# Patient Record
Sex: Female | Born: 1980 | Race: White | Hispanic: No | Marital: Married | Smoking: Former smoker
Health system: Southern US, Community
[De-identification: ages and names within clinical notes are randomized; demographics above are authoritative.]

## PROBLEM LIST (undated history)

## (undated) DIAGNOSIS — K859 Acute pancreatitis without necrosis or infection, unspecified: Secondary | ICD-10-CM

## (undated) HISTORY — PX: OTHER SURGICAL HISTORY: SHX169

---

## 2020-01-26 ENCOUNTER — Inpatient Hospital Stay (HOSPITAL_BASED_OUTPATIENT_CLINIC_OR_DEPARTMENT_OTHER): Payer: BC Managed Care – PPO

## 2020-01-26 ENCOUNTER — Inpatient Hospital Stay (HOSPITAL_COMMUNITY)
Admission: AD | Admit: 2020-01-26 | Discharge: 2020-01-26 | Disposition: A | Discharge status: Home / Self Care | Payer: BC Managed Care – PPO | Attending: Obstetrics and Gynecology | Admitting: Obstetrics and Gynecology

## 2020-01-26 ENCOUNTER — Other Ambulatory Visit: Payer: Self-pay

## 2020-01-26 ENCOUNTER — Encounter (HOSPITAL_COMMUNITY): Payer: Self-pay | Admitting: Obstetrics and Gynecology

## 2020-01-26 DIAGNOSIS — Z3686 Encounter for antenatal screening for cervical length: Secondary | ICD-10-CM

## 2020-01-26 DIAGNOSIS — Z6791 Unspecified blood type, Rh negative: Secondary | ICD-10-CM | POA: Diagnosis not present

## 2020-01-26 DIAGNOSIS — O09812 Supervision of pregnancy resulting from assisted reproductive technology, second trimester: Secondary | ICD-10-CM

## 2020-01-26 DIAGNOSIS — O360120 Maternal care for anti-D [Rh] antibodies, second trimester, not applicable or unspecified: Secondary | ICD-10-CM

## 2020-01-26 DIAGNOSIS — Z3A23 23 weeks gestation of pregnancy: Secondary | ICD-10-CM

## 2020-01-26 DIAGNOSIS — O4412 Placenta previa with hemorrhage, second trimester: Secondary | ICD-10-CM | POA: Diagnosis not present

## 2020-01-26 DIAGNOSIS — O4402 Placenta previa specified as without hemorrhage, second trimester: Secondary | ICD-10-CM | POA: Diagnosis not present

## 2020-01-26 DIAGNOSIS — O09522 Supervision of elderly multigravida, second trimester: Secondary | ICD-10-CM | POA: Diagnosis not present

## 2020-01-26 DIAGNOSIS — O4692 Antepartum hemorrhage, unspecified, second trimester: Secondary | ICD-10-CM | POA: Diagnosis not present

## 2020-01-26 DIAGNOSIS — Z87891 Personal history of nicotine dependence: Secondary | ICD-10-CM | POA: Insufficient documentation

## 2020-01-26 DIAGNOSIS — O209 Hemorrhage in early pregnancy, unspecified: Secondary | ICD-10-CM | POA: Diagnosis present

## 2020-01-26 HISTORY — DX: Acute pancreatitis without necrosis or infection, unspecified: K85.90

## 2020-01-26 LAB — URINALYSIS, ROUTINE W REFLEX MICROSCOPIC
Bilirubin Urine: NEGATIVE
Glucose, UA: NEGATIVE mg/dL
Ketones, ur: NEGATIVE mg/dL
Leukocytes,Ua: NEGATIVE
Nitrite: NEGATIVE
Protein, ur: NEGATIVE mg/dL
Specific Gravity, Urine: 1.011 (ref 1.005–1.030)
pH: 7 (ref 5.0–8.0)

## 2020-01-26 LAB — CBC
HCT: 35.6 % — ABNORMAL LOW (ref 36.0–46.0)
Hemoglobin: 12.3 g/dL (ref 12.0–15.0)
MCH: 29.7 pg (ref 26.0–34.0)
MCHC: 34.6 g/dL (ref 30.0–36.0)
MCV: 86 fL (ref 80.0–100.0)
Platelets: 270 10*3/uL (ref 150–400)
RBC: 4.14 MIL/uL (ref 3.87–5.11)
RDW: 12.4 % (ref 11.5–15.5)
WBC: 12.5 10*3/uL — ABNORMAL HIGH (ref 4.0–10.5)
nRBC: 0 % (ref 0.0–0.2)

## 2020-01-26 LAB — WET PREP, GENITAL
Clue Cells Wet Prep HPF POC: NONE SEEN
Sperm: NONE SEEN
Trich, Wet Prep: NONE SEEN
Yeast Wet Prep HPF POC: NONE SEEN

## 2020-01-26 MED ORDER — RHO D IMMUNE GLOBULIN 1500 UNIT/2ML IJ SOSY
300.0000 ug | PREFILLED_SYRINGE | Freq: Once | INTRAMUSCULAR | Status: AC
  Administered 2020-01-26: 300 ug via INTRAMUSCULAR
  Filled 2020-01-26: qty 2

## 2020-01-26 NOTE — MAU Note (Signed)
Pt reports she had some brown spotting this morning when she wiped and now is bright red. Reports a few mild cramps since this morning. Good fetal movement felt.

## 2020-01-26 NOTE — Discharge Instructions (Signed)
Placenta Previa Placenta previa is a condition in which the placenta implants in the lower part of the uterus in pregnant women. The placenta either partially or completely covers the opening to the cervix. This is a problem because the baby must pass through the cervix during delivery. There are three types of placenta previa:  Marginal placenta previa. The placenta reaches within an inch (2.5 cm) of the cervical opening but does not cover it.  Partial placenta previa. The placenta covers part of the cervical opening.  Complete placenta previa. The placenta covers the entire cervical opening. If the previa is marginal or partial and it is diagnosed in the first half of pregnancy, the placenta may move into a normal position as the pregnancy progresses and may no longer cover the cervix. It is important to keep all prenatal visits with your health care provider so you can be more closely monitored. What are the causes? The cause of this condition is not known. What increases the risk? This condition is more likely to develop in women who:  Are carrying more than one baby (multiples).  Have an abnormally shaped uterus.  Have scars on the lining of the uterus.  Have had surgeries involving the uterus, such as a cesarean delivery.  Have delivered a baby before.  Have a history of placenta previa.  Have smoked or used cocaine during pregnancy.  Are age 35 or older during pregnancy. What are the signs or symptoms? The main symptom of this condition is sudden, painless vaginal bleeding during the second half of pregnancy. The amount of bleeding can be very light at first, and it usually stops on its own. Heavier bleeding episodes may also happen. Some women with placenta previa may have no bleeding at all. How is this diagnosed?  This condition is diagnosed: ? From an ultrasound. This test uses sound waves to find where the placenta is located before you have any bleeding  episodes. ? During a checkup after vaginal bleeding is noticed.  If you are diagnosed with a partial or complete previa, digital exams with fingers will generally be avoided. Your health care provider will still perform a speculum exam.  If you did not have an ultrasound during your pregnancy, placenta previa may not be diagnosed until bleeding occurs during labor. How is this treated? Treatment for this condition may include:  Decreased activity.  Bed rest at home or in the hospital.  Pelvic rest. Nothing is placed inside the vagina during pelvic rest. This means not having sex and not using tampons or douches.  A blood transfusion to replace blood that you have lost (maternal blood loss).  A cesarean delivery. This may be performed if: ? The bleeding is heavy and cannot be controlled. ? The placenta completely covers the cervix.  Medicines to stop premature labor or to help the baby's lungs to mature. This treatment may be used if you need delivery before your pregnancy is full-term. Your treatment will be decided based on:  How much you are bleeding, or whether the bleeding has stopped.  How far along you are in your pregnancy.  The condition of your baby.  The type of placenta previa that you have.  Follow these instructions at home:  Get plenty of rest and lessen activity as told by your health care provider.  Stay on bed rest for as long as told by your health care provider.  Do not have sex, use tampons, use a douche, or place anything inside of   your vagina if your health care provider recommended pelvic rest.  Take over-the-counter and prescription medicines as told by your health care provider.  Keep all follow-up visits as told by your health care provider. This is important. Get help right away if:  You have vaginal bleeding, even if in small amounts and even if you have no pain.  You have cramping or regular contractions.  You have pain in your abdomen or  your lower back.  You have a feeling of increased pressure in your pelvis.  You have increased watery or bloody mucus from the vagina. This information is not intended to replace advice given to you by your health care provider. Make sure you discuss any questions you have with your health care provider. Document Revised: 03/13/2017 Document Reviewed: 10/13/2015 Elsevier Patient Education  2020 [employer]vier Inc.  

## 2020-01-26 NOTE — MAU Provider Note (Signed)
History     CSN: [id number]  Arrival date and time: 01/26/20 1534   First Provider Initiated Contact with Patient 01/26/20 1627      Chief Complaint  Patient presents with  . Vaginal Bleeding   39 y.o. G2P1001 @23 .1 wks presenting with spotting. Reports onset a few hours ago. First saw some brown sticky discharge then became more red when she wiped. Has not needed a pad or liner. Denies recent IC. No abdominal pain or cramping. She is unsure if she has felt FM yet this pregnancy.   OB History    Gravida  2   Para  1   Term  1   Preterm      AB      Living  1     SAB      TAB      Ectopic      Multiple      Live Births  1           Past Medical History:  Diagnosis Date  . Pancreatitis     Past Surgical History:  Procedure Laterality Date  . breast augmentation      No family history on file.  Social History   Tobacco Use  . Smoking status: Former Smoker    Types: Cigarettes    Quit date: 2021    Years since quitting: 0.7  . Smokeless tobacco: Never Used  Vaping Use  . Vaping Use: Never used  Substance Use Topics  . Alcohol use: Not Currently  . Drug use: Never    Allergies:  Allergies  Allergen Reactions  . Oxycodone Nausea And Vomiting    Medications Prior to Admission  Medication Sig Dispense Refill Last Dose  . calcium carbonate (TUMS - DOSED IN MG ELEMENTAL CALCIUM) 500 MG chewable tablet Chew 1 tablet by mouth daily.   01/26/2020 at Unknown time  . Prenatal Vit-Fe Fumarate-FA (MULTIVITAMIN-PRENATAL) 27-0.8 MG TABS tablet Take 1 tablet by mouth daily at 12 noon.   01/25/2020 at Unknown time    Review of Systems  Gastrointestinal: Negative for abdominal pain.  Genitourinary: Positive for vaginal bleeding.   Physical Exam   Blood pressure 128/82, pulse 79, temperature 99 F (37.2 C), resp. rate 18.  Physical Exam Vitals and nursing note reviewed.  Constitutional:      General: She is not in acute distress.     Appearance: Normal appearance.  HENT:     Head: Normocephalic and atraumatic.  Cardiovascular:     Rate and Rhythm: Normal rate.  Pulmonary:     Effort: Pulmonary effort is normal. No respiratory distress.  Abdominal:     Palpations: Abdomen is soft.     Tenderness: There is no abdominal tenderness.  Genitourinary:    Comments: External: no lesions or erythema Vagina: rugated, pink, moist, scant dark bloody discharge Cervix visually closed  Musculoskeletal:        General: Normal range of motion.     Cervical back: Normal range of motion.  Skin:    General: Skin is warm and dry.  Neurological:     General: No focal deficit present.     Mental Status: She is alert and oriented to person, place, and time.  Psychiatric:        Mood and Affect: Mood is anxious.        Behavior: Behavior normal.   EFM: 140 bpm, mod variability, + accels, rare variable decels Toco: none  Results for orders placed or performed  during the hospital encounter of 01/26/20 (from the past 24 hour(s))  Urinalysis, Routine w reflex microscopic     Status: Abnormal   Collection Time: 01/26/20  3:52 PM  Result Value Ref Range   Color, Urine YELLOW YELLOW   APPearance HAZY (A) CLEAR   Specific Gravity, Urine 1.011 1.005 - 1.030   pH 7.0 5.0 - 8.0   Glucose, UA NEGATIVE NEGATIVE mg/dL   Hgb urine dipstick LARGE (A) NEGATIVE   Bilirubin Urine NEGATIVE NEGATIVE   Ketones, ur NEGATIVE NEGATIVE mg/dL   Protein, ur NEGATIVE NEGATIVE mg/dL   Nitrite NEGATIVE NEGATIVE   Leukocytes,Ua NEGATIVE NEGATIVE   RBC / HPF 0-5 0 - 5 RBC/hpf   WBC, UA 0-5 0 - 5 WBC/hpf   Bacteria, UA RARE (A) NONE SEEN   Squamous Epithelial / LPF 6-10 0 - 5   Mucus PRESENT    Amorphous Crystal PRESENT   Wet prep, genital     Status: Abnormal   Collection Time: 01/26/20  4:40 PM   Specimen: Cervix  Result Value Ref Range   Yeast Wet Prep HPF POC NONE SEEN NONE SEEN   Trich, Wet Prep NONE SEEN NONE SEEN   Clue Cells Wet Prep HPF  POC NONE SEEN NONE SEEN   WBC, Wet Prep HPF POC MANY (A) NONE SEEN   Sperm NONE SEEN   Rh IG workup (includes ABO/Rh)     Status: None (Preliminary result)   Collection Time: 01/26/20  5:25 PM  Result Value Ref Range   Gestational Age(Wks) 23    ABO/RH(D) A NEG    Antibody Screen NEG    Unit Number [id number]    Blood Component Type RHIG    Unit division 00    Status of Unit ISSUED    Transfusion Status      OK TO TRANSFUSE Performed at [facility], [address], [place], [place] [address]   CBC     Status: Abnormal   Collection Time: 01/26/20  5:25 PM  Result Value Ref Range   WBC 12.5 (H) 4.0 - 10.5 K/uL   RBC 4.14 3.87 - 5.11 MIL/uL   Hemoglobin 12.3 12.0 - 15.0 g/dL   HCT 74.2 (L) 36 - 46 %   MCV 86.0 80.0 - 100.0 fL   MCH 29.7 26.0 - 34.0 pg   MCHC 34.6 30.0 - 36.0 g/dL   RDW 59.5 63.8 - 75.6 %   Platelets 270 150 - 400 K/uL   nRBC 0.0 0.0 - 0.2 %   MAU Course  Procedures Rhogam  MDM Records reviewed: pregnancy complicated by complete previa, AMA, and IVF. US shows anterior placenta previa and normal cervical length. No further bleeding. Consult with Dr. Macon Large, stable for discharge home with strict precautions.   Assessment and Plan   1. [redacted] weeks gestation of pregnancy   2. Encounter for antenatal screening for cervical length   3. 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 Rh negative state in antepartum period    Discharge home Follow up at [facility] as scheduled tomorrow Strict bleeding/PTL precautions Pelvic rest  Allergies as of 01/26/2020      Reactions   Oxycodone Nausea And Vomiting      Medication List    TAKE these medications   calcium carbonate 500 MG chewable tablet Commonly known as: TUMS - dosed in mg elemental calcium Chew 1 tablet by mouth daily.   multivitamin-prenatal 27-0.8 MG Tabs tablet Take 1 tablet by mouth  daily at 12 noon.      Donette Larry, CNM 01/26/2020, 8:53 PM

## 2020-01-27 LAB — GC/CHLAMYDIA PROBE AMP (~~LOC~~) NOT AT ARMC
Chlamydia: NEGATIVE
Comment: NEGATIVE
Comment: NORMAL
Neisseria Gonorrhea: NEGATIVE

## 2020-01-27 LAB — RH IG WORKUP (INCLUDES ABO/RH)
ABO/RH(D): A NEG
Antibody Screen: NEGATIVE
Gestational Age(Wks): 23
Unit division: 0

## 2020-04-26 ENCOUNTER — Other Ambulatory Visit: Payer: Self-pay

## 2020-04-26 ENCOUNTER — Inpatient Hospital Stay (HOSPITAL_COMMUNITY)
Admission: AD | Admit: 2020-04-26 | Discharge: 2020-04-27 | DRG: 786 | Disposition: A | Discharge status: Home / Self Care | Payer: BC Managed Care – PPO | Attending: Obstetrics and Gynecology | Admitting: Obstetrics and Gynecology

## 2020-04-26 ENCOUNTER — Inpatient Hospital Stay (HOSPITAL_BASED_OUTPATIENT_CLINIC_OR_DEPARTMENT_OTHER): Payer: BC Managed Care – PPO

## 2020-04-26 ENCOUNTER — Encounter (HOSPITAL_COMMUNITY): Payer: Self-pay | Admitting: Obstetrics and Gynecology

## 2020-04-26 DIAGNOSIS — O4593 Premature separation of placenta, unspecified, third trimester: Secondary | ICD-10-CM | POA: Diagnosis present

## 2020-04-26 DIAGNOSIS — U071 COVID-19: Secondary | ICD-10-CM | POA: Diagnosis present

## 2020-04-26 DIAGNOSIS — Z6791 Unspecified blood type, Rh negative: Secondary | ICD-10-CM

## 2020-04-26 DIAGNOSIS — Z885 Allergy status to narcotic agent status: Secondary | ICD-10-CM | POA: Diagnosis not present

## 2020-04-26 DIAGNOSIS — Z3A36 36 weeks gestation of pregnancy: Secondary | ICD-10-CM | POA: Diagnosis not present

## 2020-04-26 DIAGNOSIS — O4413 Placenta previa with hemorrhage, third trimester: Secondary | ICD-10-CM

## 2020-04-26 DIAGNOSIS — D62 Acute posthemorrhagic anemia: Secondary | ICD-10-CM | POA: Diagnosis not present

## 2020-04-26 DIAGNOSIS — O9852 Other viral diseases complicating childbirth: Secondary | ICD-10-CM | POA: Diagnosis present

## 2020-04-26 DIAGNOSIS — Z880 Allergy status to penicillin: Secondary | ICD-10-CM

## 2020-04-26 DIAGNOSIS — O26893 Other specified pregnancy related conditions, third trimester: Secondary | ICD-10-CM | POA: Diagnosis present

## 2020-04-26 DIAGNOSIS — O9081 Anemia of the puerperium: Secondary | ICD-10-CM | POA: Diagnosis not present

## 2020-04-26 DIAGNOSIS — O98513 Other viral diseases complicating pregnancy, third trimester: Secondary | ICD-10-CM | POA: Diagnosis present

## 2020-04-26 DIAGNOSIS — Z87891 Personal history of nicotine dependence: Secondary | ICD-10-CM

## 2020-04-26 DIAGNOSIS — O4400 Placenta previa specified as without hemorrhage, unspecified trimester: Secondary | ICD-10-CM

## 2020-04-26 DIAGNOSIS — O4403 Placenta previa specified as without hemorrhage, third trimester: Secondary | ICD-10-CM

## 2020-04-26 LAB — WET PREP, GENITAL
Clue Cells Wet Prep HPF POC: NONE SEEN
Sperm: NONE SEEN
Trich, Wet Prep: NONE SEEN
Yeast Wet Prep HPF POC: NONE SEEN

## 2020-04-26 LAB — RESP PANEL BY RT-PCR (FLU A&B, COVID) ARPGX2
Influenza A by PCR: NEGATIVE
Influenza B by PCR: NEGATIVE
SARS Coronavirus 2 by RT PCR: POSITIVE — AB

## 2020-04-26 LAB — CBC
HCT: 36.2 % (ref 36.0–46.0)
Hemoglobin: 13 g/dL (ref 12.0–15.0)
MCH: 31.8 pg (ref 26.0–34.0)
MCHC: 35.9 g/dL (ref 30.0–36.0)
MCV: 88.5 fL (ref 80.0–100.0)
Platelets: 232 10*3/uL (ref 150–400)
RBC: 4.09 MIL/uL (ref 3.87–5.11)
RDW: 12.8 % (ref 11.5–15.5)
WBC: 11.8 10*3/uL — ABNORMAL HIGH (ref 4.0–10.5)
nRBC: 0 % (ref 0.0–0.2)

## 2020-04-26 LAB — COMPREHENSIVE METABOLIC PANEL
ALT: 40 U/L (ref 0–44)
AST: 53 U/L — ABNORMAL HIGH (ref 15–41)
Albumin: 3 g/dL — ABNORMAL LOW (ref 3.5–5.0)
Alkaline Phosphatase: 154 U/L — ABNORMAL HIGH (ref 38–126)
Anion gap: 9 (ref 5–15)
BUN: 6 mg/dL (ref 6–20)
CO2: 21 mmol/L — ABNORMAL LOW (ref 22–32)
Calcium: 9 mg/dL (ref 8.9–10.3)
Chloride: 102 mmol/L (ref 98–111)
Creatinine, Ser: 0.51 mg/dL (ref 0.44–1.00)
GFR, Estimated: >60 mL/min (ref >60)
Glucose, Bld: 107 mg/dL — ABNORMAL HIGH (ref 70–99)
Potassium: 4.3 mmol/L (ref 3.5–5.1)
Sodium: 132 mmol/L — ABNORMAL LOW (ref 135–145)
Total Bilirubin: 0.5 mg/dL (ref 0.3–1.2)
Total Protein: 5.8 g/dL — ABNORMAL LOW (ref 6.5–8.1)

## 2020-04-26 LAB — TYPE AND SCREEN
ABO/RH(D): A NEG
Antibody Screen: NEGATIVE

## 2020-04-26 MED ORDER — BETAMETHASONE SOD PHOS & ACET 6 (3-3) MG/ML IJ SUSP
12.0000 mg | INTRAMUSCULAR | Status: AC
  Administered 2020-04-26 – 2020-04-27 (×2): 12 mg via INTRAMUSCULAR
  Filled 2020-04-26: qty 5

## 2020-04-26 MED ORDER — DOCUSATE SODIUM 100 MG PO CAPS
100.0000 mg | ORAL_CAPSULE | Freq: Every day | ORAL | Status: DC
  Administered 2020-04-26 – 2020-04-27 (×2): 100 mg via ORAL
  Filled 2020-04-26 (×2): qty 1

## 2020-04-26 MED ORDER — RHO D IMMUNE GLOBULIN 1500 UNIT/2ML IJ SOSY
300.0000 ug | PREFILLED_SYRINGE | Freq: Once | INTRAMUSCULAR | Status: AC
  Administered 2020-04-27: 300 ug via INTRAMUSCULAR
  Filled 2020-04-26: qty 2

## 2020-04-26 MED ORDER — PRENATAL MULTIVITAMIN CH
1.0000 | ORAL_TABLET | Freq: Every day | ORAL | Status: DC
  Administered 2020-04-26 – 2020-04-27 (×2): 1 via ORAL
  Filled 2020-04-26 (×2): qty 1

## 2020-04-26 MED ORDER — CALCIUM CARBONATE ANTACID 500 MG PO CHEW
2.0000 | CHEWABLE_TABLET | ORAL | Status: DC | PRN
  Administered 2020-04-26 – 2020-04-27 (×2): 400 mg via ORAL
  Filled 2020-04-26 (×2): qty 2

## 2020-04-26 MED ORDER — ZOLPIDEM TARTRATE 5 MG PO TABS: 5.0000 mg | ORAL_TABLET | Freq: Every evening | ORAL | Status: DC | PRN

## 2020-04-26 MED ORDER — ACETAMINOPHEN 325 MG PO TABS
650.0000 mg | ORAL_TABLET | ORAL | Status: DC | PRN
  Administered 2020-04-26 – 2020-04-27 (×2): 650 mg via ORAL
  Filled 2020-04-26 (×2): qty 2

## 2020-04-26 NOTE — MAU Provider Note (Signed)
History     [id number]  Arrival date and time: 04/26/20 8413    Chief Complaint  Patient presents with  . Vaginal Bleeding     HPI Tricia Hancock is a 40 y.o. at [redacted]w[redacted]d by patient report with PMHx notable for placement of previa in this pregnancy, IVF this pregnancy, advanced maternal age, who presents for vaginal bleeding.   Review of outside prenatal records from [facility] office (in media tab): Prenatal records notable for placenta previa and a negative blood type.  Review of discharge summary from last admission on October 14: Seen in the MAU on October 14 with vaginal bleeding.  No active bleeding at that time, was given RhoGAM and discharged home  Patient reports that earlier this morning she had a gush of blood, estimates about a cup when she woke up that soaked her pajamas.  Immediately came to the MAU to be evaluated.  Since then has had some blood while wiping but no consistent trickle.  Is having some back pain and some intermittent Braxton Hicks but no significant contractions.  Feeling baby move.  Has not had any gush of fluid.  Has had some change in vaginal discharge recently.  No recent intercourse.  Reports she was supposed to have an ultrasound today to assess her previa but instead came here.  Results not available in the computer but patient reports that placenta was starting to migrate off the office at her last ultrasound.   --/--/A NEG (01/13 1031)  OB History    Gravida  2   Para  1   Term  1   Preterm      AB      Living  1     SAB      IAB      Ectopic      Multiple      Live Births  1           Past Medical History:  Diagnosis Date  . Pancreatitis     Past Surgical History:  Procedure Laterality Date  . breast augmentation      History reviewed. No pertinent family history.  Social History   Socioeconomic History  . Marital status: Married    Spouse name: Not on file  . Number of children: Not on file  .  Years of education: Not on file  . Highest education level: Not on file  Occupational History  . Not on file  Tobacco Use  . Smoking status: Former Smoker    Types: Cigarettes    Quit date: 2021    Years since quitting: 1.0  . Smokeless tobacco: Never Used  Vaping Use  . Vaping Use: Never used  Substance and Sexual Activity  . Alcohol use: Not Currently  . Drug use: Never  . Sexual activity: Yes  Other Topics Concern  . Not on file  Social History Narrative  . Not on file   Social Determinants of Health   Financial Resource Strain: Not on file  Food Insecurity: Not on file  Transportation Needs: Not on file  Physical Activity: Not on file  Stress: Not on file  Social Connections: Not on file  Intimate Partner Violence: Not on file    Allergies  Allergen Reactions  . Oxycodone Nausea And Vomiting    No current facility-administered medications on file prior to encounter.   Current Outpatient Medications on File Prior to Encounter  Medication Sig Dispense Refill  . famotidine (PEPCID) 20 MG tablet  Take 20 mg by mouth 2 (two) times daily.    . Prenatal Vit-Fe Fumarate-FA (MULTIVITAMIN-PRENATAL) 27-0.8 MG TABS tablet Take 1 tablet by mouth daily at 12 noon.    . calcium carbonate (TUMS - DOSED IN MG ELEMENTAL CALCIUM) 500 MG chewable tablet Chew 1 tablet by mouth daily.       ROS Pertinent positives and negative per HPI, all others reviewed and negative  Physical Exam   BP 119/60   Pulse (!) 102   Resp 18   Ht 5\' 8"  (1.727 m)   Wt 96.2 kg   BMI 32.23 kg/m   Physical Exam Vitals reviewed.  Constitutional:      General: She is not in acute distress.    Appearance: She is well-developed and well-nourished. She is not diaphoretic.  Eyes:     General: No scleral icterus. Pulmonary:     Effort: Pulmonary effort is normal. No respiratory distress.  Abdominal:     General: There is no distension.     Palpations: Abdomen is soft.     Tenderness: There is no  abdominal tenderness. There is no guarding or rebound.  Genitourinary:    Comments: Sterile speculum exam shows scant amount of blood in the vaginal vault, cervix closed and long, small amount of blood at the cervical os but no active bleeding seen. Musculoskeletal:        General: No edema.  Skin:    General: Skin is warm and dry.  Neurological:     Mental Status: She is alert.     Coordination: Coordination normal.  Psychiatric:        Mood and Affect: Mood and affect normal.      Cervical Exam    Bedside Ultrasound Not done  My interpretation: n/a  FHT Baseline 155, moderate variability, +accels, no decels Toco: no clear contractions Cat: I  Labs Results for orders placed or performed during the hospital encounter of 04/26/20 (from the past 24 hour(s))  CBC     Status: Abnormal   Collection Time: 04/26/20 10:31 AM  Result Value Ref Range   WBC 11.8 (H) 4.0 - 10.5 K/uL   RBC 4.09 3.87 - 5.11 MIL/uL   Hemoglobin 13.0 12.0 - 15.0 g/dL   HCT 04/28/20 19.4 - 17.4 %   MCV 88.5 80.0 - 100.0 fL   MCH 31.8 26.0 - 34.0 pg   MCHC 35.9 30.0 - 36.0 g/dL   RDW 08.1 44.8 - 18.5 %   Platelets 232 150 - 400 K/uL   nRBC 0.0 0.0 - 0.2 %  Type and screen [facility]     Status: None   Collection Time: 04/26/20 10:31 AM  Result Value Ref Range   ABO/RH(D) A NEG    Antibody Screen NEG    Sample Expiration      04/29/2020,2359 Performed at [facility], [address] [place], [place]   Comprehensive metabolic panel     Status: Abnormal   Collection Time: 04/26/20 10:31 AM  Result Value Ref Range   Sodium 132 (L) 135 - 145 mmol/L   Potassium 4.3 3.5 - 5.1 mmol/L   Chloride 102 98 - 111 mmol/L   CO2 21 (L) 22 - 32 mmol/L   Glucose, Bld 107 (H) 70 - 99 mg/dL   BUN 6 6 - 20 mg/dL   Creatinine, Ser 04/28/20 0.44 - 1.00 mg/dL   Calcium 9.0 8.9 - 6.37 mg/dL   Total Protein 5.8 (L) 6.5 -  8.1 g/dL   Albumin 3.0 (L) 3.5 - 5.0 g/dL   AST 53 (H) 15 - 41 U/L    ALT 40 0 - 44 U/L   Alkaline Phosphatase 154 (H) 38 - 126 U/L   Total Bilirubin 0.5 0.3 - 1.2 mg/dL   GFR, Estimated >21>60 >30>60 mL/min   Anion gap 9 5 - 15  Wet prep, genital     Status: Abnormal   Collection Time: 04/26/20 10:59 AM   Specimen: Vaginal  Result Value Ref Range   Yeast Wet Prep HPF POC NONE SEEN NONE SEEN   Trich, Wet Prep NONE SEEN NONE SEEN   Clue Cells Wet Prep HPF POC NONE SEEN NONE SEEN   WBC, Wet Prep HPF POC FEW (A) NONE SEEN   Sperm NONE SEEN     Imaging US MFM OB Transvaginal  Result Date: 04/26/2020 ----------------------------------------------------------------------  OBSTETRICS REPORT                       (Signed Final 04/26/2020 12:28 pm) ---------------------------------------------------------------------- Patient Info  ID #:       [id number]                          D.O.B.:  Apr 07, 1981 (39 yrs)  Name:       Tricia Hancock                 Visit Date: 04/26/2020 11:47 am ---------------------------------------------------------------------- Performed By  Attending:        Noralee Spaceavi Shankar MD        [facility]      [facility]  Performed By:     Marcellina MillinKelly L Moser          [facility] ---------------------------------------------------------------------- Orders  #  Description                           Code        Ordered By  1  US MFM OB TRANSVAGINAL                [id number]     Lew Prout ----------------------------------------------------------------------  #  Order #                     Accession #                Episode #  1  [id number]                   [id number]                 [id number] ---------------------------------------------------------------------- Indications  Placenta previa with hemorrhage, third         O44.13  trimester  [redacted] weeks gestation of pregnancy                Z3A.36 ---------------------------------------------------------------------- Fetal  Evaluation  Num Of Fetuses:         1  Fetal Heart Rate(bpm):  176  Cardiac Activity:  Observed  Presentation:           Cephalic  Placenta:               Anterior previa  Amniotic Fluid  AFI FV:      Within normal limits  AFI Sum(cm)     %Tile       Largest Pocket(cm)  14.7            54          5  RUQ(cm)       RLQ(cm)       LUQ(cm)        LLQ(cm)  5             3.7           2.7            3.3 ---------------------------------------------------------------------- OB History  Gravidity:    2         Term:   1 ---------------------------------------------------------------------- Gestational Age  Clinical EDD:  36w 1d                                        EDD:   05/23/20  Best:          36w 1d     Det. By:  Clinical EDD             EDD:   05/23/20 ---------------------------------------------------------------------- Cervix Uterus Adnexa  Cervix  Normal appearance by transvaginal scan ---------------------------------------------------------------------- Impression  Patient is being evaluated the MAU for complaints of vaginal  bleeding.  She has known placenta previa diagnosed on  ultrasound.  A limited ultrasound study showed normal amniotic fluid and  good fetal activity.  We performed a transvaginal ultrasound  to evaluate the placenta.  Placenta is anterior and its tendon  edge is reaching the internal os.  No evidence of  retroplacental hemorrhage  Impression placenta previa.  Discussed findings and recommendations with Dr. Adrian Blackwater  (MAU). ---------------------------------------------------------------------- Recommendations  - Admission and inpatient management with antenatal  corticosteroids.  -Delivery at [redacted] weeks gestation.  -If significant bleeding recurs, patient should be delivered  regardless of completion of betamethasone course. ----------------------------------------------------------------------                  Noralee Space, MD Electronically Signed Final Report   04/26/2020 12:28 pm  ----------------------------------------------------------------------   MAU Course  Procedures  Lab Orders     SARS CORONAVIRUS 2 (TAT 6-24 HRS) Nasopharyngeal Nasopharyngeal Swab     Wet prep, genital     CBC     Comprehensive metabolic panel Meds ordered this encounter  Medications  . rho (d) immune globulin (RHIG/RHOPHYLAC) injection 300 mcg  . betamethasone acetate-betamethasone sodium phosphate (CELESTONE) injection 12 mg  . acetaminophen (TYLENOL) tablet 650 mg  . zolpidem (AMBIEN) tablet 5 mg  . docusate sodium (COLACE) capsule 100 mg  . calcium carbonate (TUMS - dosed in mg elemental calcium) chewable tablet 400 mg of elemental calcium  . prenatal multivitamin tablet 1 tablet    Imaging Orders     [place] MFM OB Transvaginal  MDM moderate  Assessment and Plan  #Bleeding in setting of placenta previa Patient presenting with episode of large-volume bleeding that is since resolved and evidence of such on sterile speculum exam setting of known anterior placenta previa.  Given 1 month since last evaluation of her placenta will send  for transvaginal ultrasound.  We will also give RhoGAM since has now been 13 weeks since she last received it and draw basic labs. 11:18 AM  Ultrasound shows patient continues to have placenta previa. Discussed with MFM Dr. Warren DanesShane Carr as well [facility] Mindi SlickerBanga, plan to admit, give antenatal steroids, RhoGAM, and observe. Delivery if has bleeding episode again, otherwise can go to 37 weeks.   #FWB FHT Cat I NST: Reactive  Admitted to antenatal  Venora MaplesMatthew M Mycheal [place]

## 2020-04-26 NOTE — H&P (Addendum)
Curtina Grills is a 40 y.o. at [redacted]w[redacted]d with PMHx notable for placenta of previa. She conceived via IVF, she is of advanced maternal age, and she presents for vaginal bleeding.  She was seen in the MAU on October 14 for vaginal bleeding.  No active bleeding at that time, was given RhoGAM and discharged home  Patient reports that earlier this morning she had a gush of blood, estimates about a cup when she woke up that soaked her pajamas.  Immediately came to the MAU to be evaluated.  Since then has had some blood while wiping but no consistent trickle.  Is having some back pain and some intermittent Braxton Hicks but no significant contractions.  Feeling baby move.  Has not had any gush of fluid.  Has had some change in vaginal discharge recently.  No recent intercourse.  Pt  was supposed to have an ultrasound today to assess her previa but instead came here.    Menstrual History: No LMP recorded. Patient is pregnant.    Past Medical History:  Diagnosis Date  . Pancreatitis     Past Surgical History:  Procedure Laterality Date  . breast augmentation      History reviewed. No pertinent family history.  Social History:  reports that she quit smoking about a year ago. Her smoking use included cigarettes. She has never used smokeless tobacco. She reports previous alcohol use. She reports that she does not use drugs.  Allergies:  Allergies  Allergen Reactions  . Oxycodone Nausea And Vomiting    Medications Prior to Admission  Medication Sig Dispense Refill Last Dose  . famotidine (PEPCID) 20 MG tablet Take 20 mg by mouth 2 (two) times daily.     . Prenatal Vit-Fe Fumarate-FA (MULTIVITAMIN-PRENATAL) 27-0.8 MG TABS tablet Take 1 tablet by mouth daily at 12 noon.   Past Month at Unknown time  . calcium carbonate (TUMS - DOSED IN MG ELEMENTAL CALCIUM) 500 MG chewable tablet Chew 1 tablet by mouth daily.       Review of Systems  Constitutional: Negative for activity change and fatigue.   Eyes: Negative for photophobia and visual disturbance.  Respiratory: Negative for chest tightness and shortness of breath.   Cardiovascular: Negative for chest pain, palpitations and leg swelling.  Gastrointestinal: Positive for abdominal pain.  Genitourinary: Positive for pelvic pain.  Musculoskeletal: Positive for back pain.  Neurological: Negative for light-headedness and headaches.  Psychiatric/Behavioral: The patient is nervous/anxious.     Blood pressure (!) 117/53, pulse 100, temperature 99 F (37.2 C), temperature source Oral, resp. rate 18, height 5\' 8"  (1.727 m), weight 96.2 kg, SpO2 98 %. Physical Exam Vitals and nursing note reviewed. Exam conducted with a chaperone present.  Constitutional:      Appearance: Normal appearance.  Pulmonary:     Effort: Pulmonary effort is normal.  Musculoskeletal:        General: Normal range of motion.     Cervical back: Normal range of motion.  Skin:    General: Skin is warm.     Capillary Refill: Capillary refill takes 2 to 3 seconds.  Neurological:     General: No focal deficit present.     Mental Status: She is alert and oriented to person, place, and time. Mental status is at baseline.  Psychiatric:        Mood and Affect: Mood normal.        Behavior: Behavior normal.        Thought Content: Thought content normal.  Judgment: Judgment normal.     Results for orders placed or performed during the hospital encounter of 04/26/20 (from the past 24 hour(s))  CBC     Status: Abnormal   Collection Time: 04/26/20 10:31 AM  Result Value Ref Range   WBC 11.8 (H) 4.0 - 10.5 K/uL   RBC 4.09 3.87 - 5.11 MIL/uL   Hemoglobin 13.0 12.0 - 15.0 g/dL   HCT 28.7 86.7 - 67.2 %   MCV 88.5 80.0 - 100.0 fL   MCH 31.8 26.0 - 34.0 pg   MCHC 35.9 30.0 - 36.0 g/dL   RDW 09.4 70.9 - 62.8 %   Platelets 232 150 - 400 K/uL   nRBC 0.0 0.0 - 0.2 %  Type and screen [facility]     Status: None   Collection Time: 04/26/20 10:31  AM  Result Value Ref Range   ABO/RH(D) A NEG    Antibody Screen NEG    Sample Expiration      04/29/2020,2359 Performed at [facility], [address], [place], [place] [address]   Comprehensive metabolic panel     Status: Abnormal   Collection Time: 04/26/20 10:31 AM  Result Value Ref Range   Sodium 132 (L) 135 - 145 mmol/L   Potassium 4.3 3.5 - 5.1 mmol/L   Chloride 102 98 - 111 mmol/L   CO2 21 (L) 22 - 32 mmol/L   Glucose, Bld 107 (H) 70 - 99 mg/dL   BUN 6 6 - 20 mg/dL   Creatinine, Ser 4.76 0.44 - 1.00 mg/dL   Calcium 9.0 8.9 - 54.6 mg/dL   Total Protein 5.8 (L) 6.5 - 8.1 g/dL   Albumin 3.0 (L) 3.5 - 5.0 g/dL   AST 53 (H) 15 - 41 U/L   ALT 40 0 - 44 U/L   Alkaline Phosphatase 154 (H) 38 - 126 U/L   Total Bilirubin 0.5 0.3 - 1.2 mg/dL   GFR, Estimated >50 >35 mL/min   Anion gap 9 5 - 15  Wet prep, genital     Status: Abnormal   Collection Time: 04/26/20 10:59 AM   Specimen: Vaginal  Result Value Ref Range   Yeast Wet Prep HPF POC NONE SEEN NONE SEEN   Trich, Wet Prep NONE SEEN NONE SEEN   Clue Cells Wet Prep HPF POC NONE SEEN NONE SEEN   WBC, Wet Prep HPF POC FEW (A) NONE SEEN   Sperm NONE SEEN   Resp Panel by RT-PCR (Flu A&B, Covid) Nasopharyngeal Swab     Status: Abnormal   Collection Time: 04/26/20  1:09 PM   Specimen: Nasopharyngeal Swab; Nasopharyngeal(NP) swabs in vial transport medium  Result Value Ref Range   SARS Coronavirus 2 by RT PCR POSITIVE (A) NEGATIVE   Influenza A by PCR NEGATIVE NEGATIVE   Influenza B by PCR NEGATIVE NEGATIVE    [place] MFM OB Transvaginal  Result Date: 04/26/2020 ----------------------------------------------------------------------  OBSTETRICS REPORT                       (Signed Final 04/26/2020 12:28 pm) ---------------------------------------------------------------------- Patient Info  ID #:       [id number]                          D.O.B.:  Sep 12, 1980 (39 yrs)  Name:       Tricia Hancock  Visit Date: 04/26/2020  11:47 am ---------------------------------------------------------------------- Performed By  Attending:        Noralee Spaceavi Shankar MD        [facility]      [facility]  Performed By:     Marcellina MillinKelly L Moser          [facility] ---------------------------------------------------------------------- Orders  #  Description                           Code        Ordered By  1  US MFM OB TRANSVAGINAL                [id number]     MATTHEW ECKSTAT ----------------------------------------------------------------------  #  Order #                     Accession #                Episode #  1  [id number]                   [id number]                 [id number] ---------------------------------------------------------------------- Indications  Placenta previa with hemorrhage, third         O44.13  trimester  [redacted] weeks gestation of pregnancy                Z3A.36 ---------------------------------------------------------------------- Fetal Evaluation  Num Of Fetuses:         1  Fetal Heart Rate(bpm):  176  Cardiac Activity:       Observed  Presentation:           Cephalic  Placenta:               Anterior previa  Amniotic Fluid  AFI FV:      Within normal limits  AFI Sum(cm)     %Tile       Largest Pocket(cm)  14.7            54          5  RUQ(cm)       RLQ(cm)       LUQ(cm)        LLQ(cm)  5             3.7           2.7            3.3 ---------------------------------------------------------------------- OB History  Gravidity:    2         Term:   1 ---------------------------------------------------------------------- Gestational Age  Clinical EDD:  36w 1d                                        EDD:   05/23/20  Best:          36w 1d     Det. By:  Clinical EDD  EDD:   05/23/20 ---------------------------------------------------------------------- Cervix Uterus Adnexa  Cervix  Normal appearance by transvaginal scan  ---------------------------------------------------------------------- Impression  Patient is being evaluated the MAU for complaints of vaginal  bleeding.  She has known placenta previa diagnosed on  ultrasound.  A limited ultrasound study showed normal amniotic fluid and  good fetal activity.  We performed a transvaginal ultrasound  to evaluate the placenta.  Placenta is anterior and its tendon  edge is reaching the internal os.  No evidence of  retroplacental hemorrhage  Impression placenta previa.  Discussed findings and recommendations with Dr. Adrian Blackwater  (MAU). ---------------------------------------------------------------------- Recommendations  - Admission and inpatient management with antenatal  corticosteroids.  -Delivery at [redacted] weeks gestation.  -If significant bleeding recurs, patient should be delivered  regardless of completion of betamethasone course. ----------------------------------------------------------------------                  Noralee Space, MD Electronically Signed Final Report   04/26/2020 12:28 pm ----------------------------------------------------------------------   Assessment/Plan: 39yo G2P1001 at 36 1/7wks with placenta previa  - stable at this time; no active bleeding - Pt to receive BMZ ( next dose due 04/27/20 at 12: pm) - Pelvic rest - US shows persistent previa with edge at os  - If pt has another bleed will recommend delivery via cesarean section - received rhogam 01/26/20 and again today - continuous monitoring overnight - covid +; precautions in place  Talyssa Gibas W Kymir Coles 04/26/2020, 6:03 PM

## 2020-04-26 NOTE — MAU Note (Addendum)
Pt report she has placenta previa. C/O lower back that started at 2am. Then she started having big gush  vaginal bleeding around 8 am. MD told her to come in. Good fetal movement felt. THis is her second bleed . Had an epidiod in October and was sent home.  Only a small smear on pad right now no active bleeding observed at this time

## 2020-04-27 LAB — GC/CHLAMYDIA PROBE AMP (~~LOC~~) NOT AT ARMC
Chlamydia: NEGATIVE
Comment: NEGATIVE
Comment: NORMAL
Neisseria Gonorrhea: NEGATIVE

## 2020-04-27 LAB — KLEIHAUER-BETKE STAIN
# Vials RhIg: 1
Fetal Cells %: 0 %
Quantitation Fetal Hemoglobin: 0 mL

## 2020-04-27 MED ORDER — FAMOTIDINE 20 MG PO TABS
10.0000 mg | ORAL_TABLET | Freq: Once | ORAL | Status: AC
  Administered 2020-04-27: 10 mg via ORAL
  Filled 2020-04-27: qty 1

## 2020-04-27 NOTE — Progress Notes (Signed)
A/P Note S: Patient endorses +FM, denies LOF, cramping. No VB since initial episode at approx 0800 yesterday morning. Tolerating PO, voiding without issue.   O:BP 110/70 (BP Location: Left Arm)   Pulse 98   Temp 98.4 F (36.9 C) (Oral)   Resp 17   Ht 5\' 8"  (1.727 m)   Wt 96.2 kg   SpO2 98%   BMI 32.23 kg/m  Gen: NAD CTAB: RRR, CTAB Abd: Gravid, appropriate for EGA GU: Neg VB on pad MSK: SCDs in place, calves NTTP PSYCH/Neuro: AAOx3  A/P: This is a 39yo G2p1001 @ 36 2/7 admitted HD#2 for VB insetting of persistent placenta previa. Rh Neg, AMA, IVF pregnancy  S/p BMTZ x1, next dose due at noon today >24hrs w/o bleeding S/p Second rhogam shot on admission, first received on 01/26/20 with minor VB Isolation for +COVID, asymptomatic Cat 1 tracing, neg TOCO  Aware that MOD is PLTCS for previa at 37wks. If no further VB, DC late in afternoon. Plan to f/u Monday as scheduled and aim for section this week.   If any VB today, plan for section for persistent VB w/ known previa.

## 2020-04-27 NOTE — Plan of Care (Signed)
  Problem: Education: Goal: Knowledge of General Education information will improve Description: Including pain rating scale, medication(s)/side effects and non-pharmacologic comfort measures Outcome: Adequate for Discharge   Problem: Health Behavior/Discharge Planning: Goal: Ability to manage health-related needs will improve Outcome: Adequate for Discharge   Problem: Clinical Measurements: Goal: Ability to maintain clinical measurements within normal limits will improve Outcome: Adequate for Discharge Goal: Will remain free from infection Outcome: Adequate for Discharge Goal: Diagnostic test results will improve Outcome: Adequate for Discharge Goal: Respiratory complications will improve Outcome: Adequate for Discharge Goal: Cardiovascular complication will be avoided Outcome: Adequate for Discharge   Problem: Activity: Goal: Risk for activity intolerance will decrease Outcome: Adequate for Discharge   Problem: Nutrition: Goal: Adequate nutrition will be maintained Outcome: Adequate for Discharge   Problem: Coping: Goal: Level of anxiety will decrease Outcome: Adequate for Discharge   Problem: Elimination: Goal: Will not experience complications related to bowel motility Outcome: Adequate for Discharge Goal: Will not experience complications related to urinary retention Outcome: Adequate for Discharge   Problem: Pain Managment: Goal: General experience of comfort will improve Outcome: Adequate for Discharge   Problem: Safety: Goal: Ability to remain free from injury will improve Outcome: Adequate for Discharge   Problem: Skin Integrity: Goal: Risk for impaired skin integrity will decrease Outcome: Adequate for Discharge   Problem: Education: Goal: Knowledge of risk factors and measures for prevention of condition will improve Outcome: Adequate for Discharge   Problem: Coping: Goal: Psychosocial and spiritual needs will be supported Outcome: Adequate for  Discharge   Problem: Respiratory: Goal: Will maintain a patent airway Outcome: Adequate for Discharge Goal: Complications related to the disease process, condition or treatment will be avoided or minimized Outcome: Adequate for Discharge   Problem: Education: Goal: Knowledge of disease or condition will improve Outcome: Adequate for Discharge Goal: Knowledge of the prescribed therapeutic regimen will improve Outcome: Adequate for Discharge Goal: Individualized Educational Video(s) Outcome: Adequate for Discharge   Problem: Clinical Measurements: Goal: Complications related to the disease process, condition or treatment will be avoided or minimized Outcome: Adequate for Discharge

## 2020-04-27 NOTE — Discharge Summary (Signed)
Physician Discharge Summary  Patient ID: Tricia Hancock MRN: [id number] DOB/AGE: Sep 02, 1980 40 y.o.  Admit date: 04/26/2020 Discharge date: 04/27/2020  Admission Diagnoses:  Discharge Diagnoses:  Active Problems:   Placenta previa   Discharged Condition: good  Hospital Course: Patient admitted on 1/13 at 36 1/7 with vaginal bleeding in setting of known previa. Last heavy bleed 0800 on 1/13, no active bleeding upon arrival to hospital. Category 1 tracing throughout, no activity on toco, patient revied 2nd rhogam plus BMTZ x2. No bleeding while inpatient. Discharged in stbale condition on HD#2. Has scheduled outpatient f/u on 1/17, aware of need for section at 37wks for MOD.    Discharge Exam: Blood pressure 120/62, pulse 88, temperature 98.4 F (36.9 C), temperature source Oral, resp. rate 19, height 5\' 8"  (1.727 m), weight 96.2 kg, SpO2 97 %. Gen: NAD CTAB: RRR, CTAB Abd: Gravid, appropriate for EGA GU: Neg VB on pad MSK: SCDs in place, calves NTTP PSYCH/Neuro: AAOx3  Disposition: Discharge disposition: 01-Home or Self Care       Discharge Instructions    Discharge diet:  No restrictions   Complete by: As directed    Fetal Kick Count:  Lie on our left side for one hour after a meal, and count the number of times your baby kicks.  If it is less than 5 times, get up, move around and drink some juice.  Repeat the test 30 minutes later.  If it is still less than 5 kicks in an hour, notify your doctor.   Complete by: As directed    No sexual activity restrictions   Complete by: As directed    Notify physician for a general feeling that "something is not right"   Complete by: As directed    Notify physician for increase or change in vaginal discharge   Complete by: As directed    Notify physician for intestinal cramps, with or without diarrhea, sometimes described as "gas pain"   Complete by: As directed    Notify physician for leaking of fluid   Complete by: As directed     Notify physician for low, dull backache, unrelieved by heat or Tylenol   Complete by: As directed    Notify physician for menstrual like cramps   Complete by: As directed    Notify physician for pelvic pressure   Complete by: As directed    Notify physician for uterine contractions.  These may be painless and feel like the uterus is tightening or the baby is  "balling up"   Complete by: As directed    Notify physician for vaginal bleeding   Complete by: As directed    PRETERM LABOR:  Includes any of the follwing symptoms that occur between 20 - [redacted] weeks gestation.  If these symptoms are not stopped, preterm labor can result in preterm delivery, placing your baby at risk   Complete by: As directed      Allergies as of 04/27/2020      Reactions   Oxycodone Nausea And Vomiting      Medication List    TAKE these medications   calcium carbonate 500 MG chewable tablet Commonly known as: TUMS - dosed in mg elemental calcium Chew 1 tablet by mouth daily.   famotidine 20 MG tablet Commonly known as: PEPCID Take 20 mg by mouth 2 (two) times daily.   multivitamin-prenatal 27-0.8 MG Tabs tablet Take 1 tablet by mouth daily at 12 noon.        Signed: 04/29/2020  Kiyanna Biegler 04/27/2020, 4:40 PM

## 2020-04-27 NOTE — Progress Notes (Signed)
Patient now s/p BMTZ x2. No further VB. Strict bleeding precautions given. DC home today, has f/u in clinic on Monday on 1/17 already scheduled

## 2020-04-28 LAB — RH IG WORKUP (INCLUDES ABO/RH)
ABO/RH(D): A NEG
Antibody Screen: NEGATIVE
Gestational Age(Wks): 36.1
Unit division: 0

## 2020-04-29 ENCOUNTER — Inpatient Hospital Stay (HOSPITAL_COMMUNITY): Payer: BC Managed Care – PPO | Admitting: Certified Registered Nurse Anesthetist

## 2020-04-29 ENCOUNTER — Other Ambulatory Visit: Payer: Self-pay

## 2020-04-29 ENCOUNTER — Inpatient Hospital Stay (HOSPITAL_COMMUNITY)
Admission: AD | Admit: 2020-04-29 | Discharge: 2020-05-01 | Disposition: A | Discharge status: Home / Self Care | Payer: BC Managed Care – PPO | Source: Home / Self Care | Attending: Obstetrics and Gynecology | Admitting: Obstetrics and Gynecology

## 2020-04-29 ENCOUNTER — Encounter (HOSPITAL_COMMUNITY)
Admission: AD | Disposition: A | Discharge status: Home / Self Care | Payer: Self-pay | Source: Home / Self Care | Attending: Obstetrics and Gynecology

## 2020-04-29 ENCOUNTER — Encounter (HOSPITAL_COMMUNITY): Payer: Self-pay | Admitting: Obstetrics and Gynecology

## 2020-04-29 DIAGNOSIS — Z87891 Personal history of nicotine dependence: Secondary | ICD-10-CM

## 2020-04-29 DIAGNOSIS — O26893 Other specified pregnancy related conditions, third trimester: Secondary | ICD-10-CM | POA: Diagnosis present

## 2020-04-29 DIAGNOSIS — Z885 Allergy status to narcotic agent status: Secondary | ICD-10-CM

## 2020-04-29 DIAGNOSIS — O9852 Other viral diseases complicating childbirth: Secondary | ICD-10-CM | POA: Diagnosis present

## 2020-04-29 DIAGNOSIS — O4413 Placenta previa with hemorrhage, third trimester: Principal | ICD-10-CM | POA: Diagnosis present

## 2020-04-29 DIAGNOSIS — U071 COVID-19: Secondary | ICD-10-CM | POA: Diagnosis present

## 2020-04-29 DIAGNOSIS — Z3A36 36 weeks gestation of pregnancy: Secondary | ICD-10-CM

## 2020-04-29 DIAGNOSIS — O4693 Antepartum hemorrhage, unspecified, third trimester: Secondary | ICD-10-CM | POA: Diagnosis present

## 2020-04-29 DIAGNOSIS — O4593 Premature separation of placenta, unspecified, third trimester: Secondary | ICD-10-CM | POA: Diagnosis present

## 2020-04-29 DIAGNOSIS — Z6791 Unspecified blood type, Rh negative: Secondary | ICD-10-CM

## 2020-04-29 DIAGNOSIS — Z880 Allergy status to penicillin: Secondary | ICD-10-CM

## 2020-04-29 DIAGNOSIS — Z98891 History of uterine scar from previous surgery: Secondary | ICD-10-CM

## 2020-04-29 LAB — CBC WITH DIFFERENTIAL/PLATELET
Abs Immature Granulocytes: 0.08 10*3/uL — ABNORMAL HIGH (ref 0.00–0.07)
Basophils Absolute: 0.1 10*3/uL (ref 0.0–0.1)
Basophils Relative: 1 %
Eosinophils Absolute: 0.1 10*3/uL (ref 0.0–0.5)
Eosinophils Relative: 1 %
HCT: 38.1 % (ref 36.0–46.0)
Hemoglobin: 13 g/dL (ref 12.0–15.0)
Immature Granulocytes: 1 %
Lymphocytes Relative: 28 %
Lymphs Abs: 2.2 10*3/uL (ref 0.7–4.0)
MCH: 30.4 pg (ref 26.0–34.0)
MCHC: 34.1 g/dL (ref 30.0–36.0)
MCV: 89 fL (ref 80.0–100.0)
Monocytes Absolute: 0.8 10*3/uL (ref 0.1–1.0)
Monocytes Relative: 11 %
Neutro Abs: 4.8 10*3/uL (ref 1.7–7.7)
Neutrophils Relative %: 58 %
Platelets: 275 10*3/uL (ref 150–400)
RBC: 4.28 MIL/uL (ref 3.87–5.11)
RDW: 13 % (ref 11.5–15.5)
WBC: 8 10*3/uL (ref 4.0–10.5)
nRBC: 0 % (ref 0.0–0.2)

## 2020-04-29 LAB — RAPID HIV SCREEN (HIV 1/2 AB+AG)
HIV 1/2 Antibodies: NONREACTIVE
HIV-1 P24 Antigen - HIV24: NONREACTIVE

## 2020-04-29 LAB — TYPE AND SCREEN
ABO/RH(D): A NEG
Antibody Screen: POSITIVE

## 2020-04-29 SURGERY — Surgical Case
Anesthesia: Spinal | Site: Abdomen | Wound class: Clean Contaminated

## 2020-04-29 MED ORDER — ONDANSETRON HCL 4 MG/2ML IJ SOLN: 4.0000 mg | Freq: Four times a day (QID) | INTRAMUSCULAR | Status: DC | PRN

## 2020-04-29 MED ORDER — CEFAZOLIN SODIUM-DEXTROSE 2-3 GM-%(50ML) IV SOLR
INTRAVENOUS | Status: DC | PRN
  Administered 2020-04-29: 2 g via INTRAVENOUS

## 2020-04-29 MED ORDER — DIPHENHYDRAMINE HCL 50 MG/ML IJ SOLN
12.5000 mg | Freq: Four times a day (QID) | INTRAMUSCULAR | Status: DC | PRN
Start: 2020-04-29 — End: 2020-05-01

## 2020-04-29 MED ORDER — DIPHENHYDRAMINE HCL 50 MG/ML IJ SOLN
12.5000 mg | Freq: Four times a day (QID) | INTRAMUSCULAR | Status: DC | PRN
Start: 2020-04-29 — End: 2020-04-29

## 2020-04-29 MED ORDER — POVIDONE-IODINE 10 % EX SWAB: 2.0000 "application " | Freq: Once | CUTANEOUS | Status: DC

## 2020-04-29 MED ORDER — FENTANYL CITRATE (PF) 100 MCG/2ML IJ SOLN
INTRAMUSCULAR | Status: AC
  Filled 2020-04-29: qty 2

## 2020-04-29 MED ORDER — DEXTROSE 5 % IV SOLN
3.0000 g | INTRAVENOUS | Status: DC
  Filled 2020-04-29: qty 3000

## 2020-04-29 MED ORDER — NALOXONE HCL 0.4 MG/ML IJ SOLN: 0.4000 mg | INTRAMUSCULAR | Status: DC | PRN

## 2020-04-29 MED ORDER — FENTANYL 50 MCG/ML IV PCA SOLN
INTRAVENOUS | Status: DC
  Administered 2020-04-29: 6 mL via INTRAVENOUS
  Administered 2020-04-30 (×2): 0.3 mL via INTRAVENOUS

## 2020-04-29 MED ORDER — PHENYLEPHRINE HCL-NACL 20-0.9 MG/250ML-% IV SOLN
INTRAVENOUS | Status: AC
  Filled 2020-04-29: qty 250

## 2020-04-29 MED ORDER — HYDROMORPHONE HCL 1 MG/ML IJ SOLN
INTRAMUSCULAR | Status: AC
  Filled 2020-04-29: qty 1

## 2020-04-29 MED ORDER — CHLOROPROCAINE HCL (PF) 3 % IJ SOLN
INTRAMUSCULAR | Status: AC
  Filled 2020-04-29: qty 20

## 2020-04-29 MED ORDER — ACETAMINOPHEN 160 MG/5ML PO SOLN: 325.0000 mg | ORAL | Status: DC | PRN

## 2020-04-29 MED ORDER — ONDANSETRON HCL 4 MG/2ML IJ SOLN
4.0000 mg | Freq: Once | INTRAMUSCULAR | Status: AC | PRN
  Administered 2020-04-29: 4 mg via INTRAVENOUS

## 2020-04-29 MED ORDER — ONDANSETRON HCL 4 MG/2ML IJ SOLN
INTRAMUSCULAR | Status: AC
  Filled 2020-04-29: qty 2

## 2020-04-29 MED ORDER — STERILE WATER FOR IRRIGATION IR SOLN
Status: DC | PRN
  Administered 2020-04-29: 1000 mL

## 2020-04-29 MED ORDER — OXYTOCIN-SODIUM CHLORIDE 30-0.9 UT/500ML-% IV SOLN
INTRAVENOUS | Status: DC | PRN
  Administered 2020-04-29 (×2): 30 [IU] via INTRAVENOUS

## 2020-04-29 MED ORDER — SODIUM CHLORIDE 0.9% FLUSH: 9.0000 mL | INTRAVENOUS | Status: DC | PRN

## 2020-04-29 MED ORDER — LACTATED RINGERS IV SOLN: INTRAVENOUS | Status: DC

## 2020-04-29 MED ORDER — FAMOTIDINE IN NACL 20-0.9 MG/50ML-% IV SOLN
20.0000 mg | Freq: Once | INTRAVENOUS | Status: AC
  Administered 2020-04-29: 20 mg via INTRAVENOUS
  Filled 2020-04-29: qty 50

## 2020-04-29 MED ORDER — IBUPROFEN 800 MG PO TABS
800.0000 mg | ORAL_TABLET | Freq: Four times a day (QID) | ORAL | Status: DC
  Administered 2020-04-30 – 2020-05-01 (×4): 800 mg via ORAL
  Filled 2020-04-29 (×4): qty 1

## 2020-04-29 MED ORDER — COCONUT OIL OIL: 1.0000 "application " | TOPICAL_OIL | Status: DC | PRN

## 2020-04-29 MED ORDER — MEPERIDINE HCL 25 MG/ML IJ SOLN: 6.2500 mg | INTRAMUSCULAR | Status: DC | PRN

## 2020-04-29 MED ORDER — KETOROLAC TROMETHAMINE 30 MG/ML IJ SOLN
30.0000 mg | Freq: Four times a day (QID) | INTRAMUSCULAR | Status: AC
  Administered 2020-04-29 – 2020-04-30 (×4): 30 mg via INTRAVENOUS
  Filled 2020-04-29 (×4): qty 1

## 2020-04-29 MED ORDER — WITCH HAZEL-GLYCERIN EX PADS: 1.0000 "application " | MEDICATED_PAD | CUTANEOUS | Status: DC | PRN

## 2020-04-29 MED ORDER — ONDANSETRON HCL 4 MG/2ML IJ SOLN
INTRAMUSCULAR | Status: DC | PRN
  Administered 2020-04-29: 4 mg via INTRAVENOUS

## 2020-04-29 MED ORDER — SIMETHICONE 80 MG PO CHEW
80.0000 mg | CHEWABLE_TABLET | ORAL | Status: DC | PRN
  Administered 2020-04-30: 80 mg via ORAL
  Filled 2020-04-29: qty 1

## 2020-04-29 MED ORDER — SIMETHICONE 80 MG PO CHEW
80.0000 mg | CHEWABLE_TABLET | Freq: Three times a day (TID) | ORAL | Status: DC
  Administered 2020-04-29 – 2020-04-30 (×4): 80 mg via ORAL
  Filled 2020-04-29 (×5): qty 1

## 2020-04-29 MED ORDER — DIPHENHYDRAMINE HCL 12.5 MG/5ML PO ELIX
12.5000 mg | ORAL_SOLUTION | Freq: Four times a day (QID) | ORAL | Status: DC | PRN
  Filled 2020-04-29: qty 5

## 2020-04-29 MED ORDER — MIDAZOLAM HCL 2 MG/2ML IJ SOLN
INTRAMUSCULAR | Status: AC
  Filled 2020-04-29: qty 2

## 2020-04-29 MED ORDER — OXYTOCIN-SODIUM CHLORIDE 30-0.9 UT/500ML-% IV SOLN
INTRAVENOUS | Status: AC
  Filled 2020-04-29: qty 500

## 2020-04-29 MED ORDER — SOD CITRATE-CITRIC ACID 500-334 MG/5ML PO SOLN
30.0000 mL | Freq: Once | ORAL | Status: AC
  Administered 2020-04-29: 30 mL via ORAL
  Filled 2020-04-29: qty 15

## 2020-04-29 MED ORDER — ZOLPIDEM TARTRATE 5 MG PO TABS: 5.0000 mg | ORAL_TABLET | Freq: Every evening | ORAL | Status: DC | PRN

## 2020-04-29 MED ORDER — FENTANYL CITRATE (PF) 100 MCG/2ML IJ SOLN: 25.0000 ug | INTRAMUSCULAR | Status: DC | PRN

## 2020-04-29 MED ORDER — DEXMEDETOMIDINE (PRECEDEX) IN NS 20 MCG/5ML (4 MCG/ML) IV SYRINGE
PREFILLED_SYRINGE | INTRAVENOUS | Status: AC
  Filled 2020-04-29: qty 5

## 2020-04-29 MED ORDER — SODIUM CHLORIDE 0.9 % IR SOLN
Status: DC | PRN
  Administered 2020-04-29: 1000 mL

## 2020-04-29 MED ORDER — DIPHENHYDRAMINE HCL 50 MG/ML IJ SOLN: 12.5000 mg | Freq: Four times a day (QID) | INTRAMUSCULAR | Status: DC | PRN

## 2020-04-29 MED ORDER — ACETAMINOPHEN 325 MG PO TABS: 325.0000 mg | ORAL_TABLET | ORAL | Status: DC | PRN

## 2020-04-29 MED ORDER — FENTANYL 50 MCG/ML IV PCA SOLN
INTRAVENOUS | Status: DC
  Filled 2020-04-29: qty 20

## 2020-04-29 MED ORDER — DIBUCAINE (PERIANAL) 1 % EX OINT: 1.0000 "application " | TOPICAL_OINTMENT | CUTANEOUS | Status: DC | PRN

## 2020-04-29 MED ORDER — DIPHENHYDRAMINE HCL 12.5 MG/5ML PO ELIX: 12.5000 mg | ORAL_SOLUTION | Freq: Four times a day (QID) | ORAL | Status: DC | PRN

## 2020-04-29 MED ORDER — MIDAZOLAM HCL 2 MG/2ML IJ SOLN
INTRAMUSCULAR | Status: DC | PRN
  Administered 2020-04-29 (×2): 1 mg via INTRAVENOUS

## 2020-04-29 MED ORDER — PRENATAL MULTIVITAMIN CH
1.0000 | ORAL_TABLET | Freq: Every day | ORAL | Status: DC
  Administered 2020-05-01: 1 via ORAL
  Filled 2020-04-29: qty 1

## 2020-04-29 MED ORDER — MORPHINE SULFATE (PF) 0.5 MG/ML IJ SOLN
INTRAMUSCULAR | Status: AC
  Filled 2020-04-29: qty 10

## 2020-04-29 MED ORDER — TETANUS-DIPHTH-ACELL PERTUSSIS 5-2.5-18.5 LF-MCG/0.5 IM SUSY: 0.5000 mL | PREFILLED_SYRINGE | Freq: Once | INTRAMUSCULAR | Status: DC

## 2020-04-29 MED ORDER — LACTATED RINGERS IV SOLN: INTRAVENOUS | Status: DC | PRN

## 2020-04-29 MED ORDER — SODIUM CHLORIDE 0.9% FLUSH
9.0000 mL | INTRAVENOUS | Status: DC | PRN
Start: 2020-04-29 — End: 2020-04-29

## 2020-04-29 MED ORDER — CHLOROPROCAINE HCL (PF) 3 % IJ SOLN
INTRAMUSCULAR | Status: DC | PRN
  Administered 2020-04-29: 2 mL

## 2020-04-29 MED ORDER — OXYTOCIN-SODIUM CHLORIDE 30-0.9 UT/500ML-% IV SOLN: 2.5000 [IU]/h | INTRAVENOUS | Status: AC

## 2020-04-29 MED ORDER — HYDROMORPHONE HCL 1 MG/ML IJ SOLN
INTRAMUSCULAR | Status: DC | PRN
  Administered 2020-04-29 (×2): .5 mg via INTRAVENOUS

## 2020-04-29 MED ORDER — DIPHENHYDRAMINE HCL 25 MG PO CAPS: 25.0000 mg | ORAL_CAPSULE | Freq: Four times a day (QID) | ORAL | Status: DC | PRN

## 2020-04-29 MED ORDER — ACETAMINOPHEN 500 MG PO TABS
1000.0000 mg | ORAL_TABLET | Freq: Four times a day (QID) | ORAL | Status: DC
  Administered 2020-04-29 – 2020-04-30 (×3): 1000 mg via ORAL
  Filled 2020-04-29 (×3): qty 2

## 2020-04-29 MED ORDER — SENNOSIDES-DOCUSATE SODIUM 8.6-50 MG PO TABS
2.0000 | ORAL_TABLET | Freq: Every day | ORAL | Status: DC
  Administered 2020-04-30: 2 via ORAL
  Filled 2020-04-29 (×2): qty 2

## 2020-04-29 MED ORDER — PRENATAL 27-0.8 MG PO TABS: 1.0000 | ORAL_TABLET | Freq: Every day | ORAL | Status: DC

## 2020-04-29 MED ORDER — PHENYLEPHRINE HCL-NACL 20-0.9 MG/250ML-% IV SOLN
INTRAVENOUS | Status: DC | PRN
  Administered 2020-04-29: 60 ug/min via INTRAVENOUS

## 2020-04-29 MED ORDER — MENTHOL 3 MG MT LOZG: 1.0000 | LOZENGE | OROMUCOSAL | Status: DC | PRN

## 2020-04-29 MED ORDER — FAMOTIDINE 20 MG PO TABS
20.0000 mg | ORAL_TABLET | Freq: Two times a day (BID) | ORAL | Status: DC
  Administered 2020-04-29 – 2020-05-01 (×2): 20 mg via ORAL
  Filled 2020-04-29 (×2): qty 1

## 2020-04-29 SURGICAL SUPPLY — 37 items
CHLORAPREP W/TINT 26ML (MISCELLANEOUS) ×2 IMPLANT
CLAMP CORD UMBIL (MISCELLANEOUS) IMPLANT
CLOTH BEACON ORANGE TIMEOUT ST (SAFETY) ×2 IMPLANT
DERMABOND ADVANCED (GAUZE/BANDAGES/DRESSINGS) ×1
DERMABOND ADVANCED .7 DNX12 (GAUZE/BANDAGES/DRESSINGS) ×1 IMPLANT
DRSG OPSITE POSTOP 4X10 (GAUZE/BANDAGES/DRESSINGS) ×2 IMPLANT
ELECT REM PT RETURN 9FT ADLT (ELECTROSURGICAL) ×2
ELECTRODE REM PT RTRN 9FT ADLT (ELECTROSURGICAL) ×1 IMPLANT
EXTRACTOR VACUUM BELL STYLE (SUCTIONS) IMPLANT
GAUZE SPONGE 4X4 12PLY STRL LF (GAUZE/BANDAGES/DRESSINGS) IMPLANT
GLOVE BIO SURGEON STRL SZ 6 (GLOVE) ×2 IMPLANT
GLOVE BIOGEL PI IND STRL 6.5 (GLOVE) ×1 IMPLANT
GLOVE BIOGEL PI IND STRL 7.0 (GLOVE) ×2 IMPLANT
GLOVE BIOGEL PI INDICATOR 6.5 (GLOVE) ×1
GLOVE BIOGEL PI INDICATOR 7.0 (GLOVE) ×2
GOWN STRL REUS W/TWL LRG LVL3 (GOWN DISPOSABLE) ×4 IMPLANT
HEMOSTAT ARISTA ABSORB 3G PWDR (HEMOSTASIS) ×2 IMPLANT
KIT ABG SYR 3ML LUER SLIP (SYRINGE) IMPLANT
NEEDLE HYPO 25X5/8 SAFETYGLIDE (NEEDLE) IMPLANT
NS IRRIG 1000ML POUR BTL (IV SOLUTION) ×2 IMPLANT
PACK C SECTION WH (CUSTOM PROCEDURE TRAY) ×2 IMPLANT
PAD ABD 8X10 STRL (GAUZE/BANDAGES/DRESSINGS) IMPLANT
PAD OB MATERNITY 4.3X12.25 (PERSONAL CARE ITEMS) ×2 IMPLANT
PENCIL SMOKE EVAC W/HOLSTER (ELECTROSURGICAL) ×2 IMPLANT
RTRCTR C-SECT PINK 25CM LRG (MISCELLANEOUS) IMPLANT
SUT PDS AB 0 CTX 36 PDP370T (SUTURE) IMPLANT
SUT PLAIN 2 0 (SUTURE)
SUT PLAIN ABS 2-0 CT1 27XMFL (SUTURE) IMPLANT
SUT VIC AB 0 CT1 36 (SUTURE) ×8 IMPLANT
SUT VIC AB 2-0 CT1 27 (SUTURE) ×1
SUT VIC AB 2-0 CT1 TAPERPNT 27 (SUTURE) ×1 IMPLANT
SUT VIC AB 3-0 SH 27 (SUTURE)
SUT VIC AB 3-0 SH 27X BRD (SUTURE) IMPLANT
SUT VIC AB 4-0 KS 27 (SUTURE) ×2 IMPLANT
TOWEL OR 17X24 6PK STRL BLUE (TOWEL DISPOSABLE) ×2 IMPLANT
TRAY FOLEY W/BAG SLVR 14FR LF (SET/KITS/TRAYS/PACK) ×2 IMPLANT
WATER STERILE IRR 1000ML POUR (IV SOLUTION) ×2 IMPLANT

## 2020-04-29 NOTE — Progress Notes (Signed)
OBOR Charge RN notified regarding pt being prepared for Cesarean d/t light VB secondary placenta previa.

## 2020-04-29 NOTE — Anesthesia Preprocedure Evaluation (Signed)
Anesthesia Evaluation  Patient identified by MRN, date of birth, ID band Patient awake    Reviewed: Allergy & Precautions, H&P , NPO status , Patient's Chart, lab work & pertinent test results, reviewed documented beta blocker date and time   Airway Mallampati: II  TM Distance: >3 FB Neck ROM: full    Dental no notable dental hx.    Pulmonary neg pulmonary ROS, former smoker,    Pulmonary exam normal breath sounds clear to auscultation       Cardiovascular negative cardio ROS Normal cardiovascular exam Rhythm:regular Rate:Normal     Neuro/Psych negative neurological ROS  negative psych ROS   GI/Hepatic negative GI ROS, Neg liver ROS,   Endo/Other  negative endocrine ROS  Renal/GU negative Renal ROS  negative genitourinary   Musculoskeletal   Abdominal   Peds  Hematology negative hematology ROS (+)   Anesthesia Other Findings   Reproductive/Obstetrics (+) Pregnancy                             Anesthesia Physical Anesthesia Plan  ASA: II and emergent  Anesthesia Plan: Spinal   Post-op Pain Management:    Induction:   PONV Risk Score and Plan: 2  Airway Management Planned: Natural Airway, Nasal Cannula and Simple Face Mask  Additional Equipment: None  Intra-op Plan:   Post-operative Plan:   Informed Consent: I have reviewed the patients History and Physical, chart, labs and discussed the procedure including the risks, benefits and alternatives for the proposed anesthesia with the patient or authorized representative who has indicated his/her understanding and acceptance.       Plan Discussed with: Anesthesiologist  Anesthesia Plan Comments:         Anesthesia Quick Evaluation

## 2020-04-29 NOTE — Progress Notes (Signed)
Deetta Perla, RN, [facility] notified regarding pt going to OR.

## 2020-04-29 NOTE — Op Note (Signed)
C-Section Operative Note  Date: 04/29/20  Preoperative Diagnosis: IUP @ 36 4/7, vaginal bleeding in setting of known previa Postoperative Diagnosis: IUP @ 36 4/7 with placental abruption Procedure: PLTCS with midline T-extention Surgeon: Ellison Hughs, MD Assist: Shelda Jakes, CST Findings: Viable female infant weighing 3110g with APGARS of 8 and 8 at 1 and 5 minutes, respectively. Upon hysterotomy, confirmed placental abruption, small clot adherent. Otherwise, normal appearing uterus, bilateral fallopian tubes and ovaries. Specimens: placenta EBL 565 IVF 1600 UOP 100  Patient Course: Patient presented to MAU with repeat episode of mild to moderate vaginal bleeding. Third such episode this pregnancy with known placenta previa, most recently admitted from 1/13-14 for BMTZ and second Rhogam (Aneg). Category 1 tracing with abdomen NTTP, however cervix dilated to 1cm, slow dark bleeding on exam. Urgent section called at this time. Patient already known to be COVID + on 1/13, overall asymptomatic at time of surgery  Consent:  R/B/A of cesarean section discussed with patient. Alternative would be vaginal delivery which would mean shorter postpartum stay and decreased risk of bleeding. Risks of section include infection of the uterus, pelvic organs, or skin, inadvertent injury to internal organs, such as bowel or bladder. If there is major injury, extensive surgery may be required. If injury is minor, it may be treated with relative ease. Discussed possibility of excessive blood loss and transfusion. If bleeding cannot be controlled using medical or minor surgical methods, a cesarean hysterectomy may be performed which would mean no future fertility. Patient accepts the possibility of blood transfusion, if necessary. Patient understands and agrees to move forward with section.   Operative Procedure: Patient was taken to the operating room where spinal anesthesia was found to be adequate by Allis clamp  test. She was prepped and draped in the normal sterile fashion in the dorsal supine position with a leftward tilt. An appropriate time out was performed. A Pfannenstiel skin incision was then made with the scalpel and carried through to the underlying layer of fascia by sharp dissection and Bovie cautery. The fascia was nicked in the midline and the incision was extended laterally with Mayo scissors. The superior aspect of the incision was grasped Coker clamps and dissected off the underlying rectus muscles. In a similar fashion the inferior aspect was dissected off the rectus muscles. Rectus muscles were separated in the midline and the peritoneal cavity entered bluntly. The peritoneal incision was then extended both superiorly and inferiorly with careful attention to avoid both bowel and bladder. The Alexis self-retaining wound retractor was then placed within the incision and the lower uterine segment exposed. The bladder flap was developed with Metzenbaum scissors and pushed away from the lower uterine segment. The lower uterine segment was then incised in a low transverse fashion and the cavity itself entered bluntly. The incision was extended bluntly at which point placental abruption was confirmed. Delivery of fetal head made difficult given location of placenta, however more inferiorly, amniotic sac was able to be ruptured and fluid was noted to be clear in color. The infant's head was then lifted and delivered from the incision after small midline T-extension made. Loose nuchal x1. The remainder of the infant delivered and the nose and mouth bulb suctioned with the cord clamped and cut as well. The infant was handed off immediately  to NICU; cried but appeared pale. The placenta was then spontaneously expressed from the uterus and the uterus cleared of all clots and debris with moist lap sponge. The uterine incision was then  repaired in 2 layers the first layer was a running locked layer of 0-vicryl and the  second an imbricating layer of the same suture. T-extension closed in similar manner. The tubes and ovaries were inspected and the gutters cleared of all clots and debris. The uterine incision was inspected and found to be hemostatic. Arista applied. All instruments and sponges as well as the Alexis retractor were then removed from the abdomen.  The rectus muscles were then reapproximated with several interrupted mattress sutures of 2-0 Vicryl. The fascia was then closed with 0 Vicryl in a running fashion. Subcutaneous tissue was reapproximated with 3-0 plain in a running fashion. The skin was closed with a subcuticular stitch of 4-0 Vicryl on a Keith needle and then reinforced with Dermabond and a Honeycombn. At the conclusion of the procedure all instruments and sponge counts were correct. Patient was taken to the recovery room in good condition. Informed husband about baby in NICU.  Per anesthesia, PCA orders placed for patient. After completion of procedure, baby seen in NICU where color appears to be improving and where he tolerated PO as well. His CBC is pending.

## 2020-04-29 NOTE — Transfer of Care (Signed)
Immediate Anesthesia Transfer of Care Note  Patient: Tricia Hancock  Procedure(s) Performed: CESAREAN SECTION (N/A Abdomen)  Patient Location: PACU  Anesthesia Type:Spinal  Level of Consciousness: awake, alert  and oriented  Airway & Oxygen Therapy: Patient Spontanous Breathing and Patient connected to nasal cannula oxygen  Post-op Assessment: Report given to RN and Post -op Vital signs reviewed and stable  Post vital signs: Reviewed and stable  Last Vitals:  Vitals Value Taken Time  BP 118/78 04/29/20 1445  Temp 36.7 C 04/29/20 1430  Pulse 65 04/29/20 1452  Resp 26 04/29/20 1452  SpO2 100 % 04/29/20 1452  Vitals shown include unvalidated device data.  Last Pain:  Vitals:   04/29/20 1445  TempSrc:   PainSc: 5          Complications: No complications documented.

## 2020-04-29 NOTE — Anesthesia Procedure Notes (Signed)
Spinal  Patient location during procedure: OR Start time: 04/29/2020 1:18 PM End time: 04/29/2020 1:22 PM Staffing Anesthesiologist: Bethena Midget, MD Preanesthetic Checklist Completed: patient identified, IV checked, site marked, risks and benefits discussed, surgical consent, monitors and equipment checked, pre-op evaluation and timeout performed Spinal Block Patient position: sitting Prep: DuraPrep Patient monitoring: heart rate, cardiac monitor, continuous pulse ox and blood pressure Approach: midline Location: L3-4 Injection technique: single-shot Needle Needle type: Sprotte  Needle gauge: 24 G Needle length: 9 cm Assessment Sensory level: T4 Additional Notes 2cc 3% Chloroprocaine - PF

## 2020-04-29 NOTE — H&P (Addendum)
Tricia Hancock is a 40 y.o. female presenting for evaluation of vaginal bleeding in setting of known placenta previa. Bright red blood on underpants, approx 0900, minimal cramping. +FM, denies frank LOF. Third such episode, was discharged on 1/14 for similar episode during which she received BMTZ course and second Rhogam shot.  PNC c/b AMA< IVF pregnancy, COVID positive on 1/13, is asymptomatic. Elevated 1hr, passed 3hgr OB History    Gravida  2   Para  1   Term  1   Preterm      AB      Living  1     SAB      IAB      Ectopic      Multiple      Live Births  1          Past Medical History:  Diagnosis Date  . Pancreatitis    Past Surgical History:  Procedure Laterality Date  . breast augmentation     Family History: family history is not on file. Social History:  reports that she quit smoking about 12 months ago. Her smoking use included cigarettes. She has never used smokeless tobacco. She reports previous alcohol use. She reports that she does not use drugs.     Maternal Diabetes: No1hr 156, 3hr WNL Genetic Screening: Normal CFm, SMA, Frag X, dclined materniT21 Maternal Ultrasounds/Referrals: Normal Fetal Ultrasounds or other Referrals:  None Maternal Substance Abuse:  No Significant Maternal Medications:  None Significant Maternal Lab Results:  None Other Comments:  None  Review of Systems  Constitutional: Negative for chills and fever.  Respiratory: Negative for shortness of breath.   Cardiovascular: Negative for chest pain, palpitations and leg swelling.  Gastrointestinal: Negative for abdominal pain and vomiting.  Neurological: Negative for dizziness, weakness and headaches.  Psychiatric/Behavioral: Negative for suicidal ideas.   Maternal Medical History:  Reason for admission: Vaginal bleeding.   Contractions: Frequency: rare.    Fetal activity: Perceived fetal activity is normal.    Prenatal complications: Placental abnormality.   Prenatal  Complications - Diabetes: none.      Blood pressure 122/79, pulse 93, temperature 98.5 F (36.9 C), temperature source Oral, resp. rate 18, SpO2 98 %. Exam Physical Exam Constitutional:      General: She is not in acute distress.    Appearance: She is well-developed and well-nourished.  HENT:     Head: Normocephalic and atraumatic.  Eyes:     Pupils: Pupils are equal, round, and reactive to light.  Cardiovascular:     Rate and Rhythm: Normal rate and regular rhythm.     Heart sounds: No murmur heard. No gallop.   Abdominal:     Tenderness: There is no abdominal tenderness. There is no guarding or rebound.  Genitourinary:    Vagina: Normal.     Uterus: Normal.      Comments: +VB With valsalva, slow extrusion of darker red thin blood, no active clots Musculoskeletal:        General: Normal range of motion.     Cervical back: Normal range of motion and neck supple.  Skin:    General: Skin is warm and dry.  Neurological:     Mental Status: She is alert and oriented to person, place, and time.     Prenatal labs: ABO, Rh: --/--/A NEG (01/13 2019) Antibody: NEG (01/13 2019) Rubella:  imm RPR:   nr HBsAg:   neg HIV:   nr GBS:   unk  Assessment/Plan: This is  a 39yo G2P1001 @ 36 4/7 by know DOC admitted with persistent VB in setting of known previa. S/p BMTZ 1/13-14, Rh neg s/p Rhogam on 1/13. Third such episode, given history will plan to move with urgent PLTCS, cervix 1cm dilated. Cat 1 tracing, irritability on TOCO. NICU, anesthesia and OB OR notified.  Patient COVID + on 1/13, precautions taken. Of note, pt clinic chart states PCN allergy however pt with no memory of this, no h/o anaphylaxis, risk fo cross reactivity for Ancef low, will more forward with this for preop ppx.   Baby girl  Carlisle Cater 04/29/2020, 12:23 PM

## 2020-04-29 NOTE — Anesthesia Postprocedure Evaluation (Signed)
Anesthesia Post Note  Patient: Tricia Hancock  Procedure(s) Performed: CESAREAN SECTION (N/A Abdomen)     Patient location during evaluation: PACU Anesthesia Type: Spinal Level of consciousness: oriented and awake and alert Pain management: pain level controlled Vital Signs Assessment: post-procedure vital signs reviewed and stable Respiratory status: spontaneous breathing, respiratory function stable and patient connected to nasal cannula oxygen Cardiovascular status: blood pressure returned to baseline and stable Postop Assessment: no headache, no backache and no apparent nausea or vomiting Anesthetic complications: no   No complications documented.  Last Vitals:  Vitals:   04/29/20 1600 04/29/20 1615  BP: 123/86 (!) 120/95  Pulse: 66 (!) 57  Resp: (!) 21 16  Temp: 36.6 C 36.6 C  SpO2: 97% 99%    Last Pain:  Vitals:   04/29/20 1615  TempSrc: Oral  PainSc:    Pain Goal:    LLE Motor Response: Purposeful movement (04/29/20 1600) LLE Sensation: Numbness (04/29/20 1600) RLE Motor Response: Purposeful movement (04/29/20 1600) RLE Sensation: Numbness (04/29/20 1600)     Epidural/Spinal Function Cutaneous sensation: Tingles (04/29/20 1600), Patient able to flex knees: Yes (04/29/20 1600), Patient able to lift hips off bed: No (04/29/20 1600), Back pain beyond tenderness at insertion site: No (04/29/20 1600), Progressively worsening motor and/or sensory loss: No (04/29/20 1600), Bowel and/or bladder incontinence post epidural: No (04/29/20 1600)  [place] Knee

## 2020-04-29 NOTE — Progress Notes (Signed)
Called Dr. Reina Fuse regarding maintenance IV fluids for pt. Order received. Reviewed that pt's assessment is WNL, tolerating po fluids, VSS.

## 2020-04-29 NOTE — MAU Note (Signed)
Pt reports to mau with c/o 1 episode of bright red vag bleeding about 1 hour ago.  Pt denies lof or ctx.  Pt reports good fm.

## 2020-04-29 NOTE — Progress Notes (Signed)
Dr. Tacy Dura notified regarding pt posted to go to OR for Cesarean d/t light VB secondary placenta previa.

## 2020-04-30 LAB — CBC
HCT: 31.7 % — ABNORMAL LOW (ref 36.0–46.0)
Hemoglobin: 10.8 g/dL — ABNORMAL LOW (ref 12.0–15.0)
MCH: 30.3 pg (ref 26.0–34.0)
MCHC: 34.1 g/dL (ref 30.0–36.0)
MCV: 89 fL (ref 80.0–100.0)
Platelets: 237 10*3/uL (ref 150–400)
RBC: 3.56 MIL/uL — ABNORMAL LOW (ref 3.87–5.11)
RDW: 13.1 % (ref 11.5–15.5)
WBC: 8.8 10*3/uL (ref 4.0–10.5)
nRBC: 0 % (ref 0.0–0.2)

## 2020-04-30 LAB — RPR: RPR Ser Ql: NONREACTIVE

## 2020-04-30 MED ORDER — ALBUTEROL SULFATE HFA 108 (90 BASE) MCG/ACT IN AERS
INHALATION_SPRAY | RESPIRATORY_TRACT | Status: AC
  Filled 2020-04-30: qty 6.7

## 2020-04-30 MED ORDER — HYDROCODONE-ACETAMINOPHEN 5-325 MG PO TABS
2.0000 | ORAL_TABLET | Freq: Four times a day (QID) | ORAL | Status: DC | PRN
  Administered 2020-04-30 – 2020-05-01 (×2): 2 via ORAL
  Filled 2020-04-30 (×2): qty 2

## 2020-04-30 NOTE — Lactation Note (Addendum)
This note was copied from a baby's chart. Lactation Consultation Note  Patient Name: Tricia Hancock Katyana Trolinger [id number] Date: 04/30/2020 Reason for consult: Initial assessment  Age:38 hours   P2.  106w4d.  Mother hand expressed good flow then latched baby for 10 min with intermittent swallows. Set up DEBP.  If baby is latching recommend pumping every other feeding 4-6 times per day. If baby is not latching and tires at the breast, recommend pumping q 3 hours. Give volume back to baby via spoon or with finger and syringe.   Recommend family contact insurance company for pump.  Also discussed gift shop rental. Discussed LPI feeding plan.  Told parents we had donor milk as an option if needed later on. Mom made aware of O/P services, breastfeeding support groups, community resources, and our phone # for post-discharge questions.  Feed on demand with cues.  Goal 8-12+ times per day after first 24 hrs.  Place baby STS if not cueing.    Maternal Data  COVID + Has patient been taught Hand Expression?: Yes Does the patient have breastfeeding experience prior to this delivery?: Yes  Feeding Feeding Type: Breast Fed  LATCH Score Latch: Grasps breast easily, tongue down, lips flanged, rhythmical sucking.  Audible Swallowing: A few with stimulation  Type of Nipple: Everted at rest and after stimulation  Comfort (Breast/Nipple): Soft / non-tender  Hold (Positioning): Assistance needed to correctly position infant at breast and maintain latch.  LATCH Score: 8  Interventions Interventions: Breast feeding basics reviewed;Assisted with latch;Skin to skin;Hand express;DEBP  Lactation Tools Discussed/Used Pump Education: Setup, frequency, and cleaning;Milk Storage Initiated by:: Dahlia Byes Date initiated:: 04/30/20   Consult Status Consult Status: Follow-up Date: 05/01/20 Follow-up type: In-patient    Dahlia Byes [facility] 04/30/2020, 10:48 AM

## 2020-04-30 NOTE — Progress Notes (Signed)
PCA Fentanyl 50 mcg/ml discontinued at 10 am this morning.  The patient had not used any since the nurse cleared it at 0600.  I wasted 11 ml with Phillips Hay RN as my witness at 10:06 am.

## 2020-04-30 NOTE — Progress Notes (Signed)
Subjective: Postpartum Day 1: Cesarean Delivery Patient reports tolerating PO and no problems voiding.  Wants to come off PCA.  Ambulated x 2.  No COVID sx.   Objective: Vital signs in last 24 hours: Temp:  [97.6 F (36.4 C)-98.5 F (36.9 C)] 98.4 F (36.9 C) (01/17 0618) Pulse Rate:  [55-93] 61 (01/17 0618) Resp:  [11-21] 16 (01/17 0618) BP: (97-131)/(44-95) 110/69 (01/17 0618) SpO2:  [96 %-100 %] 98 % (01/17 0618) Weight:  [96.2 kg] 96.2 kg (01/16 1250)  Physical Exam:  General: alert and cooperative Lochia: appropriate Uterine Fundus: firm Incision: C/D/I   Recent Labs    04/29/20 1229 04/30/20 0422  HGB 13.0 10.8*  HCT 38.1 31.7*    Assessment/Plan: Status post Cesarean section. Doing well postoperatively.  Continue current care.  D/c PCA.   Allergic to oxycodone, will start hydrocodone which she tolerates.   D/w pt circumcision and they desire when we are able.  Baby feeding pretty well.    Oliver Pila 04/30/2020, 9:37 AM

## 2020-05-01 MED ORDER — IBUPROFEN 800 MG PO TABS: 800.0000 mg | ORAL_TABLET | Freq: Three times a day (TID) | ORAL | 1 refills | Status: AC | PRN

## 2020-05-01 NOTE — Discharge Summary (Signed)
Postpartum Discharge Summary  Date of Service updated      Patient Name: Tricia Hancock DOB: 11-15-1980 MRN: [id number]  Date of admission: 04/29/2020 Delivery date:04/29/2020  Delivering provider: Carlisle Cater  Date of discharge: 05/01/2020  Admitting diagnosis: Vaginal bleeding in pregnancy, third trimester [O46.93] Intrauterine pregnancy: [redacted]w[redacted]d     Secondary diagnosis:  Active Problems:   Vaginal bleeding in pregnancy, third trimester  Additional problems: placenta previa    Discharge diagnosis: Preterm Pregnancy Delivered and Anemia                                              Post partum procedures:none Augmentation: N/A Complications: None  Hospital course: Sceduled C/S   40 y.o. yo G2P1102 at [redacted]w[redacted]d was admitted to the hospital 04/29/2020 for scheduled cesarean section with the following indication:Previa.Delivery details are as follows:  Membrane Rupture Time/Date: 1:25 PM ,04/29/2020   Delivery Method:C-Section, Low Transverse  Details of operation can be found in separate operative note.  Patient had an uncomplicated postpartum course.  She is ambulating, tolerating a regular diet, passing flatus, and urinating well. Patient is discharged home in stable condition on  05/01/20        Newborn Data: Birth date:04/29/2020  Birth time:1:34 PM  Gender:Female  Living status:Living  Apgars:8 ,8  Weight:3110 g     Magnesium Sulfate received: No BMZ received: Yes Rhophylac:N/A  Physical exam  Vitals:   04/30/20 0940 04/30/20 1732 04/30/20 2045 05/01/20 0553  BP: (!) 100/51 115/85 116/63 111/67  Pulse: 60 71 69 76  Resp: 18 18 16 18   Temp: 98.3 F (36.8 C) 98 F (36.7 C) 98.7 F (37.1 C) 98.7 F (37.1 C)  TempSrc:   Oral Oral  SpO2: 97%   98%  Weight:      Height:       General: alert, cooperative and no distress Lochia: appropriate Uterine Fundus: firm Incision: Dressing is clean, dry, and intact DVT Evaluation: No evidence of DVT seen on physical  exam. No significant calf/ankle edema. Labs: Lab Results  Component Value Date   WBC 8.8 04/30/2020   HGB 10.8 (L) 04/30/2020   HCT 31.7 (L) 04/30/2020   MCV 89.0 04/30/2020   PLT 237 04/30/2020   CMP Latest Ref Rng & Units 04/26/2020  Glucose 70 - 99 mg/dL 04/28/2020)  BUN 6 - 20 mg/dL 6  Creatinine 505(L - 9.76 mg/dL 7.34  Sodium 1.93 - 790 mmol/L 132(L)  Potassium 3.5 - 5.1 mmol/L 4.3  Chloride 98 - 111 mmol/L 102  CO2 22 - 32 mmol/L 21(L)  Calcium 8.9 - 10.3 mg/dL 9.0  Total Protein 6.5 - 8.1 g/dL 240)  Total Bilirubin 0.3 - 1.2 mg/dL 0.5  Alkaline Phos 38 - 126 U/L 154(H)  AST 15 - 41 U/L 53(H)  ALT 0 - 44 U/L 40   Edinburgh Score: Edinburgh Postnatal Depression Scale Screening Tool 04/30/2020  I have been able to laugh and see the funny side of things. 0  I have looked forward with enjoyment to things. 0  I have blamed myself unnecessarily when things went wrong. 1  I have been anxious or worried for no good reason. 0  I have felt scared or panicky for no good reason. 0  Things have been getting on top of me. 0  I have been so unhappy that I have  had difficulty sleeping. 0  I have felt sad or miserable. 0  I have been so unhappy that I have been crying. 0  The thought of harming myself has occurred to me. 0  Edinburgh Postnatal Depression Scale Total 1      After visit meds:  Allergies as of 05/01/2020      Reactions   Oxycodone Nausea And Vomiting      Medication List    TAKE these medications   calcium carbonate 500 MG chewable tablet Commonly known as: TUMS - dosed in mg elemental calcium Chew 1 tablet by mouth daily.   famotidine 20 MG tablet Commonly known as: PEPCID Take 20 mg by mouth 2 (two) times daily.   ibuprofen 800 MG tablet Commonly known as: ADVIL Take 1 tablet (800 mg total) by mouth every 8 (eight) hours as needed for cramping.   multivitamin-prenatal 27-0.8 MG Tabs tablet Take 1 tablet by mouth daily at 12 noon.        Discharge  home in stable condition Infant Feeding: Breast Infant Disposition:home with mother Discharge instruction: per After Visit Summary and Postpartum booklet. Activity: Advance as tolerated. Pelvic rest for 6 weeks.  Diet: routine diet Anticipated Birth Control: Unsure Postpartum Appointment:6 weeks Additional Postpartum F/U: Incision check 2 weeks Future Appointments:No future appointments. Follow up Visit:  Follow-up Information    Shivaji, Valerie Roys, MD. Schedule an appointment as soon as possible for a visit in 2 week(s).   Specialty: Obstetrics and Gynecology Why: For incision check and 6 weeks for postpartum visit Circumcision in office within two weeks Contact information: [address] [place] [place] [address] [phone]                   05/01/2020 Cathrine Muster, DO

## 2020-05-01 NOTE — Progress Notes (Signed)
Subjective: Postpartum Day 2: Cesarean Delivery Patient reports tolerating PO, + flatus and no problems voiding. Lochia scant. She denies SOB, CP, fever or lightheadedness. She is bonding well with baby. Desires circumcision but understands may need to be done in office if no room available given her covid + status on admission. She desires discharge to home today if baby stable  Objective: Vital signs in last 24 hours: Temp:  [98 F (36.7 C)-98.7 F (37.1 C)] 98.7 F (37.1 C) (01/18 0553) Pulse Rate:  [69-76] 76 (01/18 0553) Resp:  [16-18] 18 (01/18 0553) BP: (111-116)/(63-85) 111/67 (01/18 0553) SpO2:  [98 %] 98 % (01/18 0553)  Physical Exam:  General: alert, cooperative and no distress Lochia: appropriate Uterine Fundus: firm Incision: no significant drainage DVT Evaluation: No evidence of DVT seen on physical exam. No significant calf/ankle edema.  Recent Labs    04/29/20 1229 04/30/20 0422  HGB 13.0 10.8*  HCT 38.1 31.7*    Assessment/Plan: Status post Cesarean section. Doing well postoperatively.  Discharge home with standard precautions and return to clinic in 2 weeks for incision check and 6 weeks for postpartum visit  Jahrell Hamor W Greenley Martone 05/01/2020, 10:17 AM

## 2020-05-01 NOTE — Discharge Instructions (Signed)
Call office with any concerns [phone] 

## 2020-05-02 LAB — SURGICAL PATHOLOGY

## 2022-03-04 IMAGING — US US MFM OB TRANSVAGINAL
1 series · 15 of 28 positions shown · non-contrast
Comparison: none

[Series 1: us mfm ob transvaginal · 30 acquisitions, 15 frames shown]
[im 1/30]
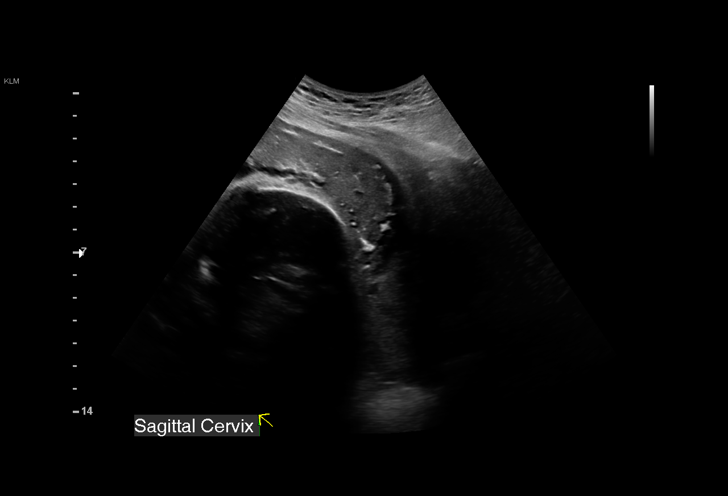
[im 3/30]
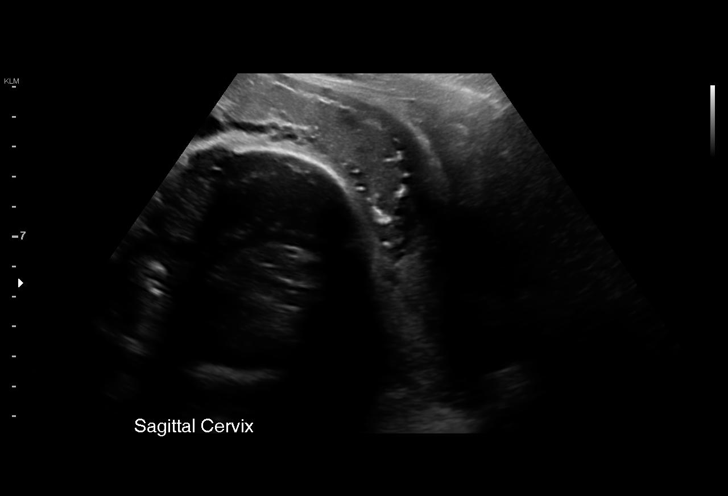
[im 5/30]
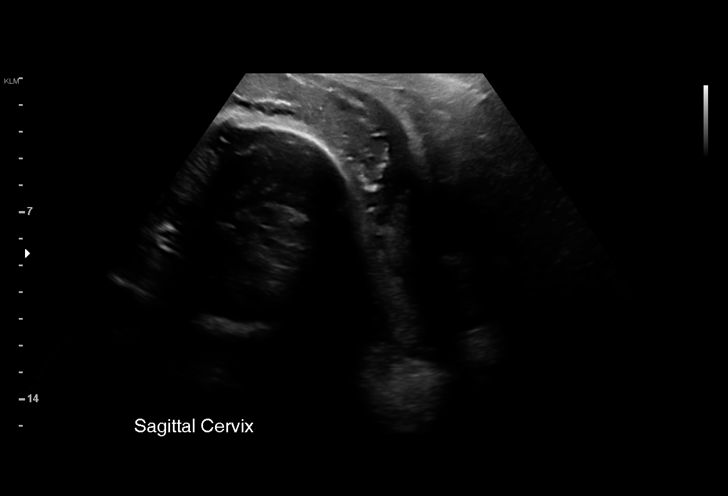
[im 7/30]
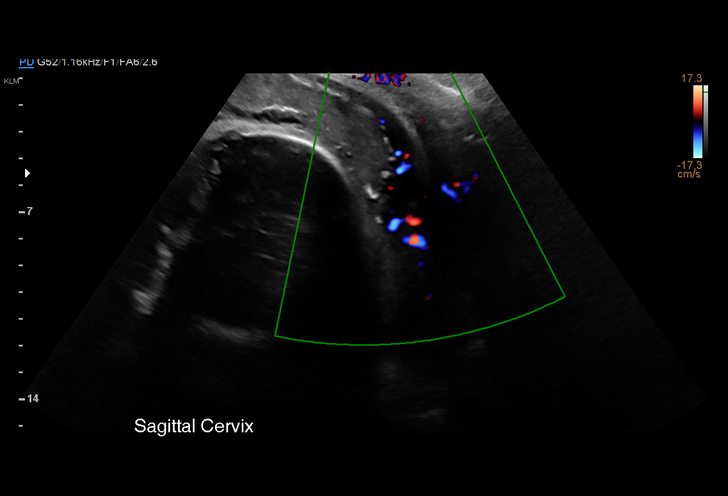
[im 9/30]
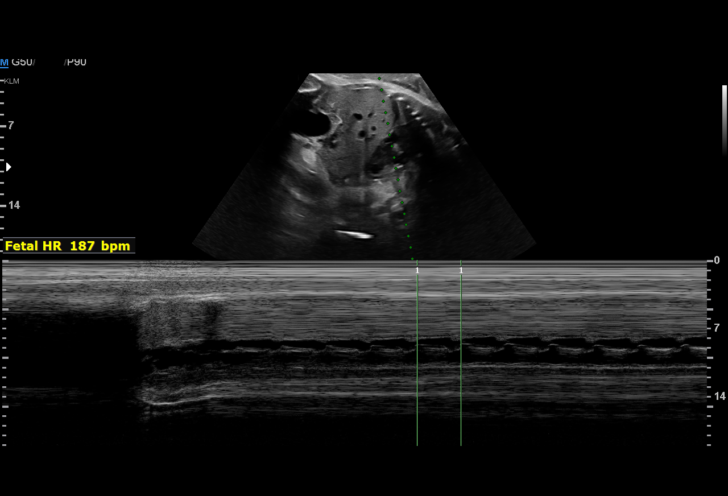
[im 11/30]
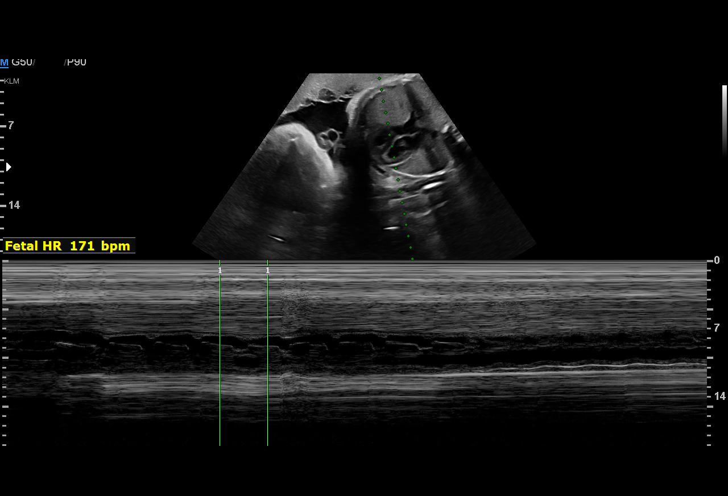
[im 13/30]
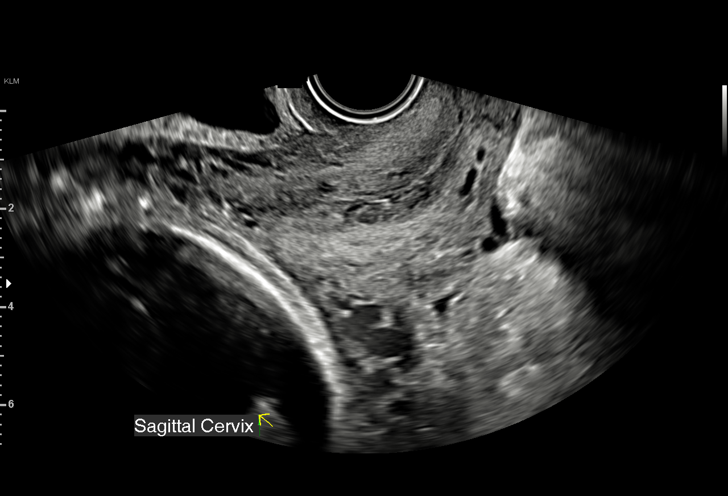
[im 16/30]
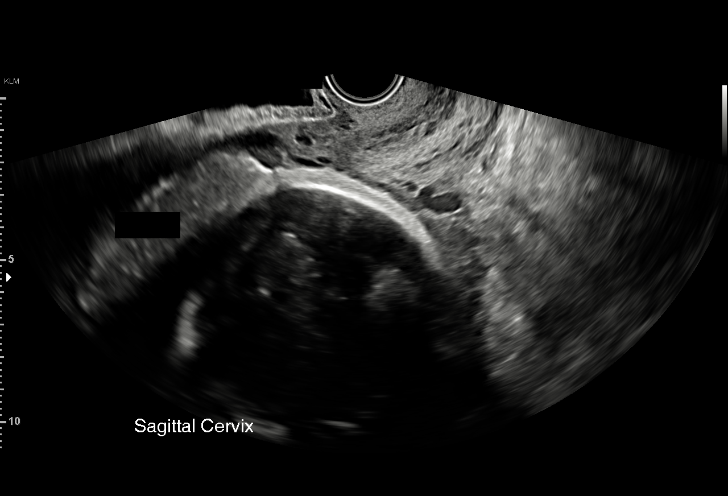
[im 17/30]
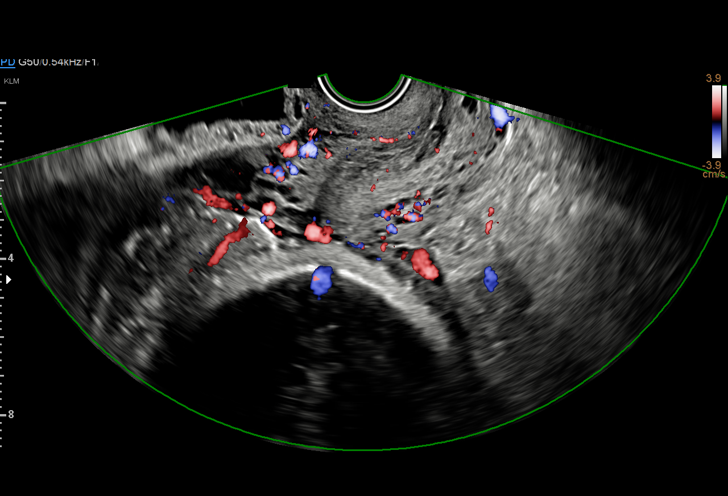
[im 19/30]
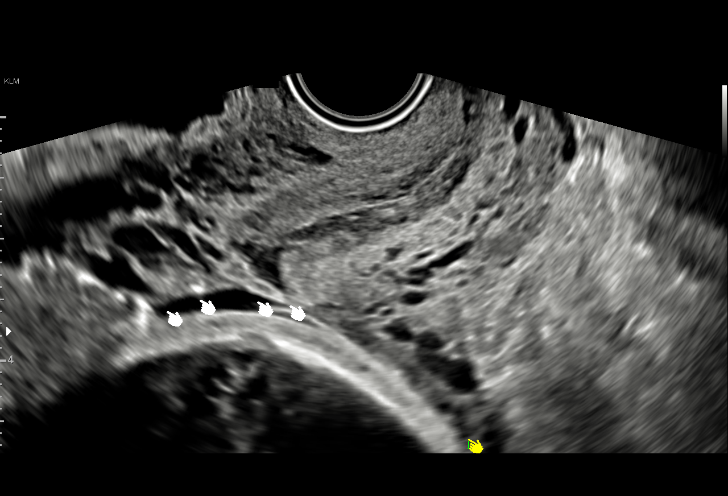
[im 21/30]
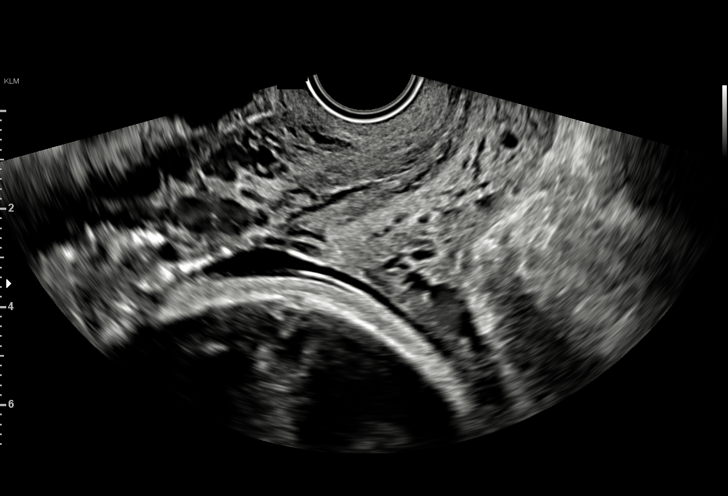
[im 23/30]
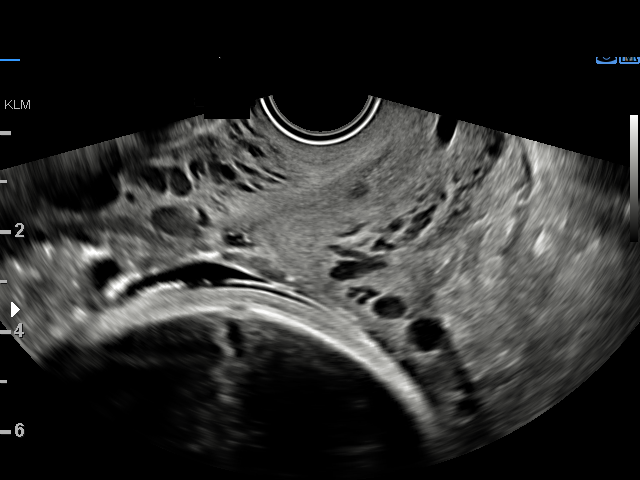
[im 25/30]
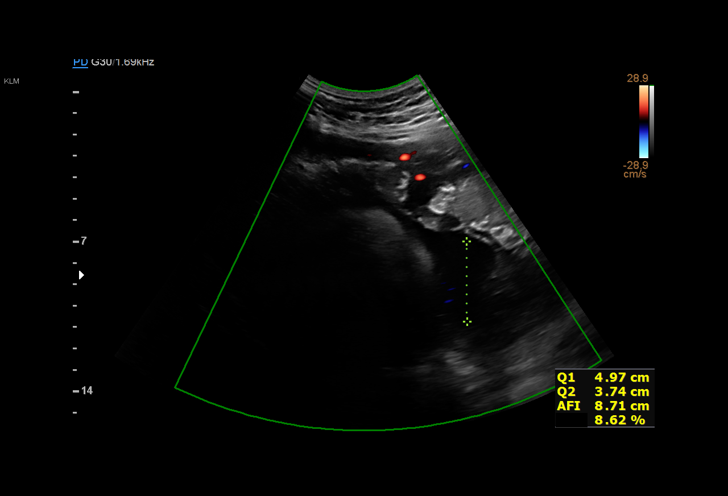
[im 27/30]
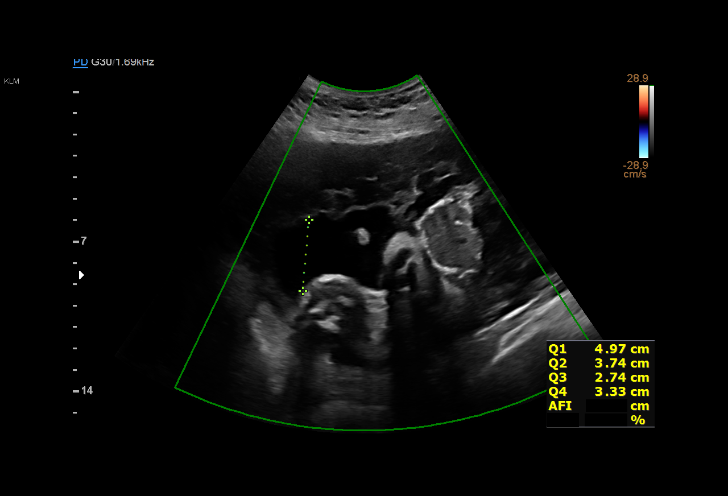
[im 30/30]
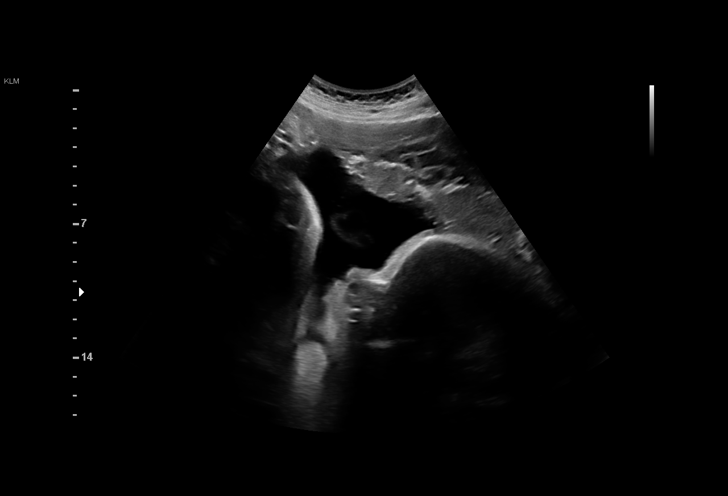

[15 of 28 positions shown; findings below may reference images not displayed]

Indications

 Placenta previa with hemorrhage, third
 trimester
 36 weeks gestation of pregnancy
Fetal Evaluation

 Num Of Fetuses:         1
 Fetal Heart Rate(bpm):  176
 Cardiac Activity:       Observed
 Presentation:           Cephalic
 Placenta:               Anterior previa

 Amniotic Fluid
 AFI FV:      Within normal limits

 AFI Sum(cm)     %Tile       Largest Pocket(cm)
 14.7            54          5

 RUQ(cm)       RLQ(cm)       LUQ(cm)        LLQ(cm)
 5
OB History

 Gravidity:    2         Term:   1
Gestational Age

 Clinical EDD:  36w 1d                                        EDD:   05/23/20
 Best:          36w 1d     Det. By:  Clinical EDD             EDD:   05/23/20
Cervix Uterus Adnexa

 Cervix
 Normal appearance by transvaginal scan
Impression

 Patient is being evaluated the [facility] for complaints of vaginal
 bleeding.  She has known placenta previa diagnosed on
 ultrasound.

 A limited ultrasound study showed normal amniotic fluid and
 good fetal activity.  We performed a transvaginal ultrasound
 to evaluate the placenta.  Placenta is anterior and its inferior
 thin edge is reaches the internal os.  No evidence of
 retroplacental hemorrhage

 Impression placenta previa.

 Discussed findings and recommendations with Dr. Stelle
 ([facility]).
Recommendations

 - Admission and inpatient management with antenatal
 corticosteroids.
 -Delivery at 37 weeks gestation.
 -If significant bleeding recurs, patient should be delivered
 regardless of completion of Buding course.
                      Abrams, Emely
